# Patient Record
Sex: Female | Born: 1950 | Race: White | Hispanic: No | Smoking: Current every day smoker
Health system: Southern US, Community
[De-identification: ages and names within clinical notes are randomized; demographics above are authoritative.]

## PROBLEM LIST (undated history)

## (undated) DIAGNOSIS — R600 Localized edema: Secondary | ICD-10-CM

## (undated) DIAGNOSIS — I879 Disorder of vein, unspecified: Secondary | ICD-10-CM

## (undated) DIAGNOSIS — K219 Gastro-esophageal reflux disease without esophagitis: Secondary | ICD-10-CM

## (undated) DIAGNOSIS — Z9889 Other specified postprocedural states: Secondary | ICD-10-CM

## (undated) DIAGNOSIS — I2510 Atherosclerotic heart disease of native coronary artery without angina pectoris: Secondary | ICD-10-CM

## (undated) DIAGNOSIS — F4320 Adjustment disorder, unspecified: Secondary | ICD-10-CM

## (undated) DIAGNOSIS — F39 Unspecified mood [affective] disorder: Secondary | ICD-10-CM

## (undated) DIAGNOSIS — F411 Generalized anxiety disorder: Secondary | ICD-10-CM

## (undated) DIAGNOSIS — R251 Tremor, unspecified: Secondary | ICD-10-CM

## (undated) DIAGNOSIS — M1990 Unspecified osteoarthritis, unspecified site: Secondary | ICD-10-CM

## (undated) DIAGNOSIS — I219 Acute myocardial infarction, unspecified: Secondary | ICD-10-CM

## (undated) DIAGNOSIS — R112 Nausea with vomiting, unspecified: Secondary | ICD-10-CM

## (undated) HISTORY — DX: Unspecified mood (affective) disorder: F39

## (undated) HISTORY — DX: Adjustment disorder, unspecified: F43.20

## (undated) HISTORY — DX: Tremor, unspecified: R25.1

## (undated) HISTORY — PX: VEIN SURGERY: SHX48

## (undated) HISTORY — DX: Acute myocardial infarction, unspecified: I21.9

## (undated) HISTORY — PX: CARDIAC CATHETERIZATION: SHX172

## (undated) HISTORY — DX: Unspecified osteoarthritis, unspecified site: M19.90

## (undated) HISTORY — DX: Generalized anxiety disorder: F41.1

---

## 1971-06-17 HISTORY — PX: TUBAL LIGATION: SHX77

## 1993-06-16 DIAGNOSIS — I219 Acute myocardial infarction, unspecified: Secondary | ICD-10-CM

## 1993-06-16 HISTORY — DX: Acute myocardial infarction, unspecified: I21.9

## 1998-07-06 ENCOUNTER — Ambulatory Visit (HOSPITAL_COMMUNITY): Admission: RE | Admit: 1998-07-06 | Discharge: 1998-07-06 | Payer: Self-pay | Admitting: Obstetrics and Gynecology

## 1998-11-30 ENCOUNTER — Other Ambulatory Visit: Admission: RE | Admit: 1998-11-30 | Discharge: 1998-11-30 | Payer: Self-pay | Admitting: Obstetrics and Gynecology

## 1999-08-23 ENCOUNTER — Ambulatory Visit (HOSPITAL_BASED_OUTPATIENT_CLINIC_OR_DEPARTMENT_OTHER): Admission: RE | Admit: 1999-08-23 | Discharge: 1999-08-23 | Payer: Self-pay

## 1999-12-12 ENCOUNTER — Other Ambulatory Visit: Admission: RE | Admit: 1999-12-12 | Discharge: 1999-12-12 | Payer: Self-pay | Admitting: Orthopedic Surgery

## 2000-03-24 ENCOUNTER — Other Ambulatory Visit: Admission: RE | Admit: 2000-03-24 | Discharge: 2000-03-24 | Payer: Self-pay | Admitting: Obstetrics and Gynecology

## 2000-07-23 ENCOUNTER — Emergency Department (HOSPITAL_COMMUNITY): Admission: EM | Admit: 2000-07-23 | Discharge: 2000-07-24 | Payer: Self-pay | Admitting: Emergency Medicine

## 2000-07-24 ENCOUNTER — Encounter: Payer: Self-pay | Admitting: Emergency Medicine

## 2001-04-28 ENCOUNTER — Other Ambulatory Visit: Admission: RE | Admit: 2001-04-28 | Discharge: 2001-04-28 | Payer: Self-pay | Admitting: Internal Medicine

## 2002-05-17 ENCOUNTER — Other Ambulatory Visit: Admission: RE | Admit: 2002-05-17 | Discharge: 2002-05-17 | Payer: Self-pay | Admitting: Obstetrics and Gynecology

## 2002-05-26 ENCOUNTER — Emergency Department (HOSPITAL_COMMUNITY): Admission: EM | Admit: 2002-05-26 | Discharge: 2002-05-26 | Payer: Self-pay | Admitting: *Deleted

## 2002-06-23 ENCOUNTER — Inpatient Hospital Stay (HOSPITAL_COMMUNITY): Admission: EM | Admit: 2002-06-23 | Discharge: 2002-06-24 | Payer: Self-pay | Admitting: *Deleted

## 2002-06-23 ENCOUNTER — Encounter: Payer: Self-pay | Admitting: *Deleted

## 2002-06-24 ENCOUNTER — Inpatient Hospital Stay (HOSPITAL_COMMUNITY): Admission: EM | Admit: 2002-06-24 | Discharge: 2002-06-28 | Payer: Self-pay | Admitting: Psychiatry

## 2004-09-09 ENCOUNTER — Ambulatory Visit: Payer: Self-pay | Admitting: Pulmonary Disease

## 2005-07-26 ENCOUNTER — Emergency Department (HOSPITAL_COMMUNITY): Admission: EM | Admit: 2005-07-26 | Discharge: 2005-07-27 | Payer: Self-pay | Admitting: Emergency Medicine

## 2007-11-02 ENCOUNTER — Telehealth: Payer: Self-pay | Admitting: Pulmonary Disease

## 2007-11-17 ENCOUNTER — Encounter: Payer: Self-pay | Admitting: Pulmonary Disease

## 2007-11-17 DIAGNOSIS — R51 Headache: Secondary | ICD-10-CM

## 2007-11-17 DIAGNOSIS — R519 Headache, unspecified: Secondary | ICD-10-CM | POA: Insufficient documentation

## 2007-11-17 DIAGNOSIS — H8309 Labyrinthitis, unspecified ear: Secondary | ICD-10-CM | POA: Insufficient documentation

## 2008-10-30 ENCOUNTER — Emergency Department (HOSPITAL_COMMUNITY): Admission: EM | Admit: 2008-10-30 | Discharge: 2008-10-30 | Payer: Self-pay | Admitting: Emergency Medicine

## 2008-11-27 ENCOUNTER — Ambulatory Visit: Payer: Self-pay | Admitting: Vascular Surgery

## 2009-04-03 ENCOUNTER — Ambulatory Visit: Payer: Self-pay | Admitting: Vascular Surgery

## 2009-05-21 ENCOUNTER — Ambulatory Visit: Payer: Self-pay | Admitting: Vascular Surgery

## 2009-05-29 ENCOUNTER — Ambulatory Visit: Payer: Self-pay | Admitting: Vascular Surgery

## 2010-09-24 LAB — CBC
HCT: 38.8 % (ref 36.0–46.0)
Hemoglobin: 13.1 g/dL (ref 12.0–15.0)
MCHC: 33.9 g/dL (ref 30.0–36.0)
MCV: 82.9 fL (ref 78.0–100.0)
Platelets: 179 10*3/uL (ref 150–400)
RBC: 4.68 MIL/uL (ref 3.87–5.11)
RDW: 14.2 % (ref 11.5–15.5)
WBC: 10.6 10*3/uL — ABNORMAL HIGH (ref 4.0–10.5)

## 2010-09-24 LAB — DIFFERENTIAL
Basophils Absolute: 0.1 10*3/uL (ref 0.0–0.1)
Basophils Relative: 1 % (ref 0–1)
Eosinophils Absolute: 0.2 10*3/uL (ref 0.0–0.7)
Eosinophils Relative: 1 % (ref 0–5)
Lymphocytes Relative: 20 % (ref 12–46)
Lymphs Abs: 2.1 10*3/uL (ref 0.7–4.0)
Monocytes Absolute: 0.5 10*3/uL (ref 0.1–1.0)
Monocytes Relative: 5 % (ref 3–12)
Neutro Abs: 7.8 10*3/uL — ABNORMAL HIGH (ref 1.7–7.7)
Neutrophils Relative %: 73 % (ref 43–77)

## 2010-09-24 LAB — BASIC METABOLIC PANEL
BUN: 8 mg/dL (ref 6–23)
CO2: 30 mEq/L (ref 19–32)
Calcium: 9 mg/dL (ref 8.4–10.5)
Chloride: 103 mEq/L (ref 96–112)
Creatinine, Ser: 0.74 mg/dL (ref 0.4–1.2)
GFR calc Af Amer: >60 mL/min (ref >60)
GFR calc non Af Amer: >60 mL/min (ref >60)
Glucose, Bld: 129 mg/dL — ABNORMAL HIGH (ref 70–99)
Potassium: 3.4 mEq/L — ABNORMAL LOW (ref 3.5–5.1)
Sodium: 140 mEq/L (ref 135–145)

## 2010-09-24 LAB — POCT CARDIAC MARKERS
CKMB, poc: 1.7 ng/mL (ref 1.0–8.0)
Myoglobin, poc: 58.1 ng/mL (ref 12–200)
Myoglobin, poc: 81.1 ng/mL (ref 12–200)

## 2010-10-29 NOTE — Procedures (Signed)
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  The patient has a history of multiple injections of  varicose veins in the past.   EXAM:  Using color-flow imaging and pulse Doppler spectral analysis, the  right and left common femoral, superficial femoral, popliteal, posterior  tibial, greater and lesser saphenous veins are evaluated.  There is no  evidence suggesting deep venous insufficiency in the right or left lower  extremity.   The right and left saphenofemoral junctions are not competent (with  reflux of >500 milliseconds).  The right and left greater saphenous  veins are not competent (with reflux of 500 milliseconds)  with the  caliber as described below.   The right and left proximal short saphenous veins demonstrate  competency.   GSV Diameter (used if found to be incompetent only)                                            Right    Left  Proximal Greater Saphenous Vein           0.6 cm   0.7 cm  Proximal-to-mid-thigh                     0.5 cm   0.7 cm  Mid thigh                                 0.5 cm   0.6 cm  Mid-distal thigh                          0.5 cm   0.3 cm  Distal thigh                              0.6 cm   0.4 cm  Knee                                      0.8 cm   0.5 cm   IMPRESSION:  1. Right and left greater saphenous vein reflux ( with reflux of >500      milliseconds) is identified with the caliber ranging from 0.5 cm to      0.8 cm from the knee to the groin on the right.  On the left 0.3 cm      to 0.7 cm.  2. The right and left greater saphenous veins are not aneurysmal.  3. The right and left greater saphenous veins are not tortuous.  4. The deep venous system is competent.  5. The right and left lesser saphenous veins are competent.     ___________________________________________  Jillian [name]. Hart Rochester, M.D.   MC/MEDQ  D:  11/27/2008  T:  11/27/2008  Job:   [id number]

## 2010-10-29 NOTE — Consult Note (Signed)
NEW PATIENT CONSULTATION   Jillian [name], Jillian [name]  DOB:  1951/06/10                                       11/27/2008  ZOXWR#:[id number]   The patient is a 60 year old female with painful varicosities in both  lower extremities particularly in the medial thigh and medial calf  areas.  She has had treatment in the distant past by Dr. Marcy Panning  consisting of sclerotherapy of painful varicosities which have given her  short periods of relief, but she continues to have recurrence of these  symptoms.  She has no history of thrombophlebitis, deep venous  thrombosis, bleeding or stasis ulcers but does have swelling in  particular in the left leg as the day progresses, with pain in both  thighs and calves which she describes as stinging, burning and  throbbing.  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  She does take  ibuprofen with some improvement but was unable to relieve her symptoms.  This does affect her daily living including her work and activities at  home.  She has not worn elastic compression stockings.   PAST MEDICAL HISTORY:  1. Coronary artery disease, previous myocardial infarction 1995.  2. Negative for diabetes, hypertension, hyperlipidemia, COPD or      stroke.   FAMILY HISTORY:  Negative coronary artery disease, diabetes or stroke.   SOCIAL HISTORY:  She is single, has no children and manages a [employer] in town.  Smokes half pack cigarettes per day.  Drinks  occasional alcohol.   REVIEW OF SYSTEMS:  Negative for weight gain, shortness of breath,  palpitations, arrhythmias, bronchitis, wheezing.  Does have a history of  reflux esophagitis as well as arthritis and joint pain.   ALLERGIES:  To NARCOTICS which caused nausea and vomiting and passing  out.   MEDICINES:  Include aspirin, potassium replacement.   PHYSICAL EXAM:  Vital signs:  Blood pressure 108/78, heart  rate 76,  respirations 14.  General:  Middle-aged female in no apparent stress,  alert and oriented x3.  Neck:  Supple, 3+ carotid pulses palpable.  No  bruits are audible.  Neurologic:  Normal.  No palpable adenopathy in  neck.  Chest:  Clear to auscultation.  Cardiovascular:  Regular rhythm.  No murmurs.  Abdomen:  Soft, nontender with no masses.  She has 3+  femoral, popliteal, posterior tibial pulses bilaterally.  Both feet are  well-perfused.  There is no hyperpigmentation or ulceration noted.  Left  leg has some mild edema of 1+ compared to the right.  She has bulging  varicosities medial aspect of the distal left thigh and medial calf as  well as the right distal medial thigh and medial calf.  There is some  small varicosities posteriorly in the calf bilaterally.   Venous duplex exam performed in our office today reveals no evidence of  deep venous obstruction with normal deep venous system.  Both great  saphenous veins have diffuse reflux with valvular incompetence extending  from the knee to the saphenofemoral junction and the small saphenous  veins are competent bilaterally.   I think this patient is having definite symptomatic varicosities from  her valvular reflux in the saphenous systems bilaterally.  We will treat  her with long-leg elastic compression stockings (20 mm -  30 mm gradient)  as well as elevation of her legs as much as her job will allow and daily  ibuprofen.  She will return in 3 months and if there has been no  improvement I think she would be well served to have bilateral laser  ablation of the great saphenous veins with multiple stab phlebectomies  beginning on the left side.   Jillian [name] Rochester, M.D.  Electronically Signed   JDL/MEDQ  D:  11/27/2008  T:  11/28/2008  Job:  [id number]   cc:   Malachi Pro. Ambrose Mantle, M.D.

## 2010-10-29 NOTE — Assessment & Plan Note (Signed)
OFFICE VISIT   Duff, Nara K  DOB:  [birth year]                                       04/03/2009  QIONG#:[id number]   The patient returns for further followup regarding her severe venous  insufficiency of both lower extremities which resulted in painful  varicosities.  She continues to have aching, burning and throbbing  discomfort in both thigh and calf areas of the great saphenous system  where the bulging varicosities are located and this is not improved  wearing long-leg elastic compression stockings (20 mm - 30 mm gradient)  as well as elevation of the legs as much as her job will permit and  ibuprofen on a daily basis.  She develops swelling in the left calf and  ankle as the day progresses and her symptoms are affecting her during  work when she stands and also at home doing housework.  She does have  gross reflux in both great saphenous veins including the saphenofemoral  junction which communicate with these painful varicosities in the medial  thigh and calf.  She has had no chest pain, dyspnea on exertion, PND,  orthopnea or other problems.   I think we should proceed with staged procedures beginning on the left  side to include laser ablation and multiple stab phlebectomies on the  left initially followed by the right.  This is affecting her daily  living and ability to work and I think the symptoms can be improved or  relieved by these procedures.   [name], M.D.  Electronically Signed   JDL/MEDQ  D:  04/03/2009  T:  04/04/2009  Job:  [id number]   cc:   Malachi Pro. Ambrose Mantle, M.D.

## 2010-10-29 NOTE — Procedures (Signed)
DUPLEX DEEP VENOUS EXAM - LOWER EXTREMITY   INDICATION:  Followup left greater saphenous vein ablation.   HISTORY:  Edema:  No.  Trauma/Surgery:  Left greater saphenous vein ablation 05/21/2009.  Pain:  Minor tenderness.  PE:  No.  Previous DVT:  No.  Anticoagulants:  No.  Other:   DUPLEX EXAM:                CFV   SFV   PopV  PTV    GSV                R  L  R  L  R  L  R   L  R  L  Thrombosis    0  0     0     0      0     +  Spontaneous   +  +     +     +      +     0  Phasic        +  +     +     +      +     0  Augmentation  +  +     +     +      +     0  Compressible  +  +     +     +      +     0  Competent     +  +     +     +      +     0   Legend:  + - yes  o - no  p - partial  D - decreased   IMPRESSION:  1. No evidence of deep venous thrombosis in the left lower extremity      or right common femoral vein.  2. Evidence of ablation of the left greater saphenous vein without      flow from mid thigh to knee.    _____________________________  [name], M.D.   AS/MEDQ  D:  05/29/2009  T:  05/30/2009  Job:  [id number]

## 2010-10-29 NOTE — Assessment & Plan Note (Signed)
OFFICE VISIT   [name], Jillian K  DOB:  1950/08/13                                       05/21/2009  EAVWU#:[id number]   The patient underwent laser ablation of the left great saphenous vein  with multiple stab phlebectomies (10-20).  She required two separate  laser procedures.  The vein was cannulated from the proximal calf in the  GSV which would only extend to the mid thigh because of tortuous  varicosities.  A second puncture in the mid thigh up the great saphenous  vein to the saphenofemoral junction was performed and both sites had  laser ablation with a total of 80-100 joules.  She also had stab  phlebectomies and will return in 1 week for a duplex scan of the left  leg.  She has the right leg scheduled January 2011.   [name], M.D.  Electronically Signed   JDL/MEDQ  D:  05/21/2009  T:  05/22/2009  Job:  [id number]

## 2010-10-29 NOTE — Assessment & Plan Note (Signed)
OFFICE VISIT   [name], Jillian K  DOB:  [birth year]                                       05/29/2009  ZOXWR#:[id number]   The patient returns 1 week post laser ablation of the left great  saphenous vein for painful varicosities in the left leg with multiple  stab phlebectomies.  She had two separate laser ablations performed, one  from the proximal calf to the mid thigh and one from the mid thigh to  the saphenofemoral junction.  She has had some moderate discomfort in  the mid thigh area with some bruising.  No discomfort proximally.  She  has continued to have some mild distal edema but no pain in the stab  phlebectomy sites.  On examination she has some diffuse bruising in the  mid thigh as one would expect with some tenderness along the course of  the great saphenous vein.  There is mild edema distally.  She has a  palpable distal pulse in the left leg.   Venous duplex exam today reveals no evidence of deep venous obstruction.  She does have total ablation of the left great saphenous vein from the  proximal calf to the mid thigh but the proximal saphenous vein where the  second area of ablation was performed is widely patent with gross reflux  at the saphenofemoral junction.  This will require a second treatment  but I would like for the right leg to be done initially and let the  inflammation in the left leg resolve before proceeding with a repeat  laser ablation of the left proximal saphenous vein.  This will lead to  persistent symptoms and pain if this is not closed because she does have  gross reflux at the saphenofemoral junction down to the mid thigh.  The  distal vein however is totally ablated.  We will schedule her for her  right leg after the first of the year and then return for the redo of  the left great saphenous vein proximally at a later date.   [name], M.D.  Electronically Signed   JDL/MEDQ  D:  05/29/2009  T:  05/30/2009   Job:  [id number]
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
## 2010-11-01 NOTE — Op Note (Signed)
[place]  Patient:    Jillian [name], Jillian [name]                         MRN: [id number] Proc. Date: 08/23/99 Adm. Date:  [id number] Disc. Date: [id number] Attending:  Meredith Leeds                           Operative Report  PREOPERATIVE DIAGNOSIS:  Bilateral varicose veins.  POSTOPERATIVE DIAGNOSIS:  Bilateral varicose veins.  OPERATION:  Bilateral ligation and excision of varicose veins.  SURGEON:  Zigmund Daniel, M.D.  ANESTHESIA:  General.  DESCRIPTION OF PROCEDURE:  Before being taken into the operating room, I had the patient stand for a period of time, and marked all visible varicosities.  After  induction of anesthesia, adequate monitoring, and routine preparation and draping of the lower extremities, I made multiple incisions at the sites of marked varicosities and dissected them out.  I ligated perforating veins and small tributaries, and avulsed varicosities.  After getting hemostasis with pressure, I closed the skin incisions with intracuticular 4-0 Vicryl and Steri-Strips, and applied bulky compressive bandages.  She tolerated the operation well. DD:  09/10/99 TD:  09/11/99 Job: [id number]

## 2010-11-01 NOTE — Discharge Summary (Signed)
[place]  Patient:    Jillian [name], Jillian [name]                         MRN: [id number] Adm. Date:  [id number] Disc. Date: [id number] Attending:  Meredith Leeds                           Discharge Summary  NO DICTATION DD:  09/17/99 TD:  09/19/99 Job: [id number]
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
## 2011-06-17 HISTORY — PX: COLONOSCOPY W/ POLYPECTOMY: SHX1380

## 2012-02-09 ENCOUNTER — Other Ambulatory Visit: Payer: Self-pay | Admitting: *Deleted

## 2012-02-09 DIAGNOSIS — I83893 Varicose veins of bilateral lower extremities with other complications: Secondary | ICD-10-CM

## 2012-02-13 ENCOUNTER — Encounter: Payer: Self-pay | Admitting: Vascular Surgery

## 2012-02-17 ENCOUNTER — Ambulatory Visit: Payer: Self-pay | Admitting: Vascular Surgery

## 2012-07-22 ENCOUNTER — Ambulatory Visit (INDEPENDENT_AMBULATORY_CARE_PROVIDER_SITE_OTHER): Payer: BC Managed Care – PPO | Admitting: Family Medicine

## 2012-07-22 VITALS — BP 121/79 | HR 73 | Temp 98.0°F | Resp 18 | Ht 63.5 in | Wt 165.6 lb

## 2012-07-22 DIAGNOSIS — M7989 Other specified soft tissue disorders: Secondary | ICD-10-CM

## 2012-07-22 DIAGNOSIS — Z86718 Personal history of other venous thrombosis and embolism: Secondary | ICD-10-CM

## 2012-07-23 DIAGNOSIS — Z86718 Personal history of other venous thrombosis and embolism: Secondary | ICD-10-CM | POA: Insufficient documentation

## 2012-07-23 NOTE — Progress Notes (Signed)
  Subjective:    Patient ID: Jillian [name], female    DOB: July 08, 1950, 62 y.o.   MRN: [id number]  HPI  1.  LE edema:  Patient presented today s/p fall for worker's comp evaluation.  However, on physical examination noted LE edema.  Patient mentioned that a vein specialist told her that she had DVT after performing and ultrasound and "then walked out of the room."  Was never started on anticoagulation.  This occurred about 1 year ago.  LE edema has been persistent for past 3-5 years.    She went home and looked up what DVT is and became frightened, but did not return for evaluation.  No dyspnea, no history of DVT/PE prior to what vein specialist told her.  Has not seen another physician since then.   No fevers or chills, no redness/warmth to legs.  Swelling improved with leg elevation.   She does take daily baby ASA   Review of Systems See HPI above for review of systems.       Objective:   Physical Exam  Gen:  Alert, cooperative patient who appears stated age in no acute distress.  Vital signs reviewed. Pulm:  Clear to auscultation bilaterally with good air movement.  No wheezes or rales noted.   LE:  +2 edema noted BL LE's at ankles.  No calf tenderness BL.  No warmth or redness noted.  Multiple varicose veins noted BL.        Assessment & Plan:   1.  DVT, questionable history of: - Unclear if vein specialist was actually referring to superficial vein thrombosis - however, patient adamant he said DVT and she used the words "deep vein thrombosis" stating this is what she was told. - By now, this will likely have resolved.  - No unilateral swelling of one leg worse than other, no warmth, no tenderness.  No clear evidence of current DVT - However I think she warrants at least an [place] to ensure this has indeed resolved, if it was ever present in first case.  - Will call and discuss results with patients.  Of note, she is returning on Sunday for recheck of her knee

## 2012-07-27 ENCOUNTER — Telehealth: Payer: Self-pay | Admitting: Radiology

## 2012-07-27 NOTE — Progress Notes (Signed)
Please call GI and find out the status of her LE ultrasound to rule- out DVT.  I do not see the result yet.  This needs to be done as soon as possible- thanks!

## 2012-07-27 NOTE — Telephone Encounter (Signed)
I am checking status of the doppler study. This is not done yet. Lupita Leash in referrals will take care of this today. I am not clear why patient was not contacted to schedule this. Malin Cervini

## 2012-07-27 NOTE — Telephone Encounter (Signed)
Thanks for following up on this.  Jillian [name]

## 2012-07-30 ENCOUNTER — Telehealth: Payer: Self-pay | Admitting: Radiology

## 2012-07-30 ENCOUNTER — Other Ambulatory Visit: Payer: Self-pay

## 2012-07-30 NOTE — Telephone Encounter (Signed)
Please advise, it does not look like doppler study done yet.

## 2012-07-31 ENCOUNTER — Telehealth: Payer: Self-pay | Admitting: Family Medicine

## 2012-07-31 NOTE — Telephone Encounter (Signed)
Called her to check on the status of her ultrasound- it had been scheduled for Thursday but was canceled due to weather. She plans to have it done early this week at GI

## 2012-08-06 ENCOUNTER — Telehealth: Payer: Self-pay | Admitting: Family Medicine

## 2012-08-06 NOTE — Telephone Encounter (Signed)
Called and spoke to her again- the ultrasound is still not done.  I did encourage her to go ahead and do this in case she may actually have a DVT, explained that a DVT can become a PE and be dangerous. She does not feel that she can afford the ultrasound right now, but plans to do it in the next few few days.  Encouraged her to have this done as soon as she can

## 2012-08-06 NOTE — Progress Notes (Signed)
I have read, reviewed, and agree with plan of care by Dr. Gwendolyn Grant Please see phone notes- Ms. Rance has not yet had her ultrasound.  I have encouraged her to try and do this as soon as possible. From Dr. Tyson Alias notes it sees that an acute DVT is unlikely, but an ultrasound is a good next step

## 2012-08-30 NOTE — Telephone Encounter (Signed)
Patient referred to Jillian [name], she states they did not need doppler

## 2012-11-17 ENCOUNTER — Other Ambulatory Visit: Payer: Self-pay | Admitting: Orthopaedic Surgery

## 2012-11-18 ENCOUNTER — Encounter (HOSPITAL_BASED_OUTPATIENT_CLINIC_OR_DEPARTMENT_OTHER): Payer: Self-pay | Admitting: *Deleted

## 2012-11-18 NOTE — Progress Notes (Signed)
Pt has CAD-mi 1995-saw dr Tresa Endo until 2 yr ago-released-called for notes-no meds-does not see a dr-she has vericose veins-lower ankle edema-on feet all day-was seen by vein dr-wanted doppler studies done-did not have money-denies any calf pain or reddness-knee is swollen and painful-does smoke.

## 2012-11-19 ENCOUNTER — Ambulatory Visit
Admission: RE | Admit: 2012-11-19 | Discharge: 2012-11-19 | Disposition: A | Discharge status: Home / Self Care | Payer: BC Managed Care – PPO | Source: Ambulatory Visit | Attending: Family Medicine | Admitting: Family Medicine

## 2012-11-19 DIAGNOSIS — Z86718 Personal history of other venous thrombosis and embolism: Secondary | ICD-10-CM

## 2012-11-19 DIAGNOSIS — M7989 Other specified soft tissue disorders: Secondary | ICD-10-CM

## 2012-11-19 NOTE — H&P (Signed)
Jillian [name] is an 62 y.o. female.   Chief Complaint: Right knee medial meniscus tear HPI: Jillian [name] is having increasing right knee pain.  She has been through physical therapy but is having continued pain that is severe.  She is complaining of pain at nighttime as well as with ambulation that limits her activities.  MRI scan  Is positive for a medial meniscus tear right knee.  Past Medical History  Diagnosis Date  . Arthritis   . Myocardial infarction 1995    saw dr Tresa Endo   . PONV (postoperative nausea and vomiting)   . Edema leg   . Vein disorder     lower legs  . GERD (gastroesophageal reflux disease)   . Coronary artery disease     Past Surgical History  Procedure Laterality Date  . Tubal ligation  1973  . Colonoscopy w/ polypectomy  2013  . Vein surgery      History reviewed. No pertinent family history. Social History:  reports that she has been smoking.  She does not have any smokeless tobacco history on file. She reports that  drinks alcohol. She reports that she does not use illicit drugs.  Allergies:  Allergies  Allergen Reactions  . Opium Nausea And Vomiting    Patient states all narcotics make her N/V    No prescriptions prior to admission    No results found for this or any previous visit (from the past 48 hour(s)). No results found.  Review of Systems  Cardiovascular:       History of MI  All other systems reviewed and are negative.    Height 5' 3.5" (1.613 m), weight 74.844 kg (165 lb). Physical Exam  Constitutional: She is oriented to person, place, and time. She appears well-nourished.  HENT:  Head: Atraumatic.  Eyes: Conjunctivae are normal.  Neck: Normal range of motion.  Cardiovascular: Regular rhythm.   Respiratory: Breath sounds normal.  GI: Bowel sounds are normal.  Musculoskeletal:  Right knee exam: Trace effusion, range of motion 0-1 10.  Pain along the medial joint line to palpation.  Positive McMurray's for medial meniscus pain.   Mild crepitation.  Neurological: She is oriented to person, place, and time.  Skin: Skin is warm.  Psychiatric: She has a normal mood and affect.     Assessment/Plan Assessment: Right knee torn medial meniscus Plan Jillian [name] has tried therapy and things have gotten worse for her.  We have discussed proceeding with a knee arthroscopy to lessen her pain and improve her function.  We have reviewed the risk of anesthesia, infection and potential DVT related to knee arthroscopy.  Also discussed the need for physical therapy to optimize her good results.  Jillian [name] 11/19/2012, 1:13 PM

## 2012-11-20 ENCOUNTER — Telehealth: Payer: Self-pay

## 2012-11-20 NOTE — Telephone Encounter (Signed)
PATIENT STATES DR. Gwendolyn Grant ORDERED HER TO HAVE AN ULTRASOUND OF HER (R) KNEE. SHE HAD IT DONE YESTERDAY (FRIDAY). THEY TOLD HER WE WOULD HAVE THE RESULTS TODAY. SHE WOULD LIKE TO GET THEM AS SOON AS POSSIBLE. SHE HAS SURGERY SCHEDULED ON Tuesday MORNING. SHE WANTS TO BE SURE SHE CAN GO AHEAD WITH THE SURGERY AND THAT SHE DOES NOT HAVE A BLOOD CLOT.  BEST PHONE [phone] (HOME) (SHE WANTS A MESSAGE LEFT ON HER MACHINE IF SHE DOES NOT ANSWER)    PHARMACY CHOICE IS [facility] ON [address].   MBC

## 2012-11-21 NOTE — Telephone Encounter (Signed)
Appreciate calling the patient and discussing with her.  Agree with care.

## 2012-11-21 NOTE — Telephone Encounter (Signed)
1. No evidence of deep vein thrombosis in either lower extremity. 2. 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 3. Elongated right popliteal fossa Baker's cyst.  Patient advised. Told her to use compression stockings and to elevate when possible.  FYI to you patient aware of results.

## 2012-11-23 ENCOUNTER — Encounter (HOSPITAL_BASED_OUTPATIENT_CLINIC_OR_DEPARTMENT_OTHER): Payer: Self-pay | Admitting: Anesthesiology

## 2012-11-23 ENCOUNTER — Ambulatory Visit (HOSPITAL_BASED_OUTPATIENT_CLINIC_OR_DEPARTMENT_OTHER): Payer: Worker's Compensation | Admitting: Anesthesiology

## 2012-11-23 ENCOUNTER — Ambulatory Visit (HOSPITAL_BASED_OUTPATIENT_CLINIC_OR_DEPARTMENT_OTHER)
Admission: RE | Admit: 2012-11-23 | Discharge: 2012-11-23 | Disposition: A | Discharge status: Home / Self Care | Payer: Worker's Compensation | Source: Ambulatory Visit | Attending: Orthopaedic Surgery | Admitting: Orthopaedic Surgery

## 2012-11-23 ENCOUNTER — Encounter (HOSPITAL_BASED_OUTPATIENT_CLINIC_OR_DEPARTMENT_OTHER): Payer: Self-pay | Admitting: *Deleted

## 2012-11-23 ENCOUNTER — Encounter (HOSPITAL_BASED_OUTPATIENT_CLINIC_OR_DEPARTMENT_OTHER)
Admission: RE | Disposition: A | Discharge status: Home / Self Care | Payer: Self-pay | Source: Ambulatory Visit | Attending: Orthopaedic Surgery

## 2012-11-23 DIAGNOSIS — M23329 Other meniscus derangements, posterior horn of medial meniscus, unspecified knee: Secondary | ICD-10-CM | POA: Insufficient documentation

## 2012-11-23 DIAGNOSIS — K219 Gastro-esophageal reflux disease without esophagitis: Secondary | ICD-10-CM | POA: Insufficient documentation

## 2012-11-23 DIAGNOSIS — I2510 Atherosclerotic heart disease of native coronary artery without angina pectoris: Secondary | ICD-10-CM | POA: Insufficient documentation

## 2012-11-23 DIAGNOSIS — M129 Arthropathy, unspecified: Secondary | ICD-10-CM | POA: Insufficient documentation

## 2012-11-23 DIAGNOSIS — S83241A Other tear of medial meniscus, current injury, right knee, initial encounter: Secondary | ICD-10-CM

## 2012-11-23 DIAGNOSIS — I252 Old myocardial infarction: Secondary | ICD-10-CM | POA: Insufficient documentation

## 2012-11-23 DIAGNOSIS — M2240 Chondromalacia patellae, unspecified knee: Secondary | ICD-10-CM | POA: Insufficient documentation

## 2012-11-23 HISTORY — DX: Other specified postprocedural states: Z98.890

## 2012-11-23 HISTORY — PX: KNEE ARTHROSCOPY: SHX127

## 2012-11-23 HISTORY — DX: Nausea with vomiting, unspecified: R11.2

## 2012-11-23 HISTORY — DX: Gastro-esophageal reflux disease without esophagitis: K21.9

## 2012-11-23 HISTORY — DX: Atherosclerotic heart disease of native coronary artery without angina pectoris: I25.10

## 2012-11-23 HISTORY — DX: Localized edema: R60.0

## 2012-11-23 HISTORY — DX: Disorder of vein, unspecified: I87.9

## 2012-11-23 SURGERY — ARTHROSCOPY, KNEE
Anesthesia: General | Site: Knee | Laterality: Right | Wound class: Clean

## 2012-11-23 MED ORDER — OXYCODONE HCL 5 MG PO TABS: 5.0000 mg | ORAL_TABLET | Freq: Once | ORAL | Status: DC | PRN

## 2012-11-23 MED ORDER — SODIUM CHLORIDE 0.9 % IR SOLN
Status: DC | PRN
  Administered 2012-11-23: 3000 mL

## 2012-11-23 MED ORDER — LIDOCAINE HCL (CARDIAC) 20 MG/ML IV SOLN
INTRAVENOUS | Status: DC | PRN
  Administered 2012-11-23: 60 mg via INTRAVENOUS

## 2012-11-23 MED ORDER — PROPOFOL 10 MG/ML IV BOLUS
INTRAVENOUS | Status: DC | PRN
  Administered 2012-11-23: 160 mg via INTRAVENOUS

## 2012-11-23 MED ORDER — BUPIVACAINE-EPINEPHRINE PF 0.25-1:200000 % IJ SOLN
INTRAMUSCULAR | Status: DC | PRN
  Administered 2012-11-23: 20 mL

## 2012-11-23 MED ORDER — LACTATED RINGERS IV SOLN: INTRAVENOUS | Status: DC

## 2012-11-23 MED ORDER — CHLORHEXIDINE GLUCONATE 4 % EX LIQD: 60.0000 mL | Freq: Once | CUTANEOUS | Status: DC

## 2012-11-23 MED ORDER — ONDANSETRON HCL 4 MG/2ML IJ SOLN
INTRAMUSCULAR | Status: DC | PRN
  Administered 2012-11-23: 4 mg via INTRAVENOUS

## 2012-11-23 MED ORDER — PROMETHAZINE HCL 12.5 MG PO TABS: 12.5000 mg | ORAL_TABLET | Freq: Four times a day (QID) | ORAL | Status: DC | PRN

## 2012-11-23 MED ORDER — OXYCODONE HCL 5 MG/5ML PO SOLN: 5.0000 mg | Freq: Once | ORAL | Status: DC | PRN

## 2012-11-23 MED ORDER — TRAMADOL HCL 50 MG PO TABS: 50.0000 mg | ORAL_TABLET | Freq: Four times a day (QID) | ORAL | Status: DC | PRN

## 2012-11-23 MED ORDER — FENTANYL CITRATE 0.05 MG/ML IJ SOLN
INTRAMUSCULAR | Status: DC | PRN
  Administered 2012-11-23: 100 ug via INTRAVENOUS

## 2012-11-23 MED ORDER — CEFAZOLIN SODIUM-DEXTROSE 2-3 GM-% IV SOLR: 2.0000 g | INTRAVENOUS | Status: DC

## 2012-11-23 MED ORDER — MIDAZOLAM HCL 5 MG/5ML IJ SOLN
INTRAMUSCULAR | Status: DC | PRN
  Administered 2012-11-23: 1 mg via INTRAVENOUS

## 2012-11-23 MED ORDER — LACTATED RINGERS IV SOLN
INTRAVENOUS | Status: DC
  Administered 2012-11-23: 10:00:00 via INTRAVENOUS

## 2012-11-23 MED ORDER — HYDROMORPHONE HCL PF 1 MG/ML IJ SOLN
0.2500 mg | INTRAMUSCULAR | Status: DC | PRN
  Administered 2012-11-23: 0.5 mg via INTRAVENOUS

## 2012-11-23 MED ORDER — DEXTROSE 5 % IV SOLN: 3.0000 g | INTRAVENOUS | Status: DC

## 2012-11-23 MED ORDER — PROMETHAZINE HCL 25 MG/ML IJ SOLN
6.2500 mg | INTRAMUSCULAR | Status: DC | PRN
  Administered 2012-11-23: 6.25 mg via INTRAVENOUS

## 2012-11-23 MED ORDER — DEXAMETHASONE SODIUM PHOSPHATE 4 MG/ML IJ SOLN
INTRAMUSCULAR | Status: DC | PRN
  Administered 2012-11-23: 10 mg via INTRAVENOUS

## 2012-11-23 MED ORDER — HYDROMORPHONE HCL PF 1 MG/ML IJ SOLN: 0.2500 mg | INTRAMUSCULAR | Status: DC | PRN

## 2012-11-23 SURGICAL SUPPLY — 40 items
BANDAGE ELASTIC 6 VELCRO ST LF (GAUZE/BANDAGES/DRESSINGS) ×2 IMPLANT
BANDAGE GAUZE ELAST BULKY 4 IN (GAUZE/BANDAGES/DRESSINGS) ×2 IMPLANT
BLADE CUDA 5.5 (BLADE) IMPLANT
BLADE GREAT WHITE 4.2 (BLADE) ×2 IMPLANT
CANISTER OMNI JUG 16 LITER (MISCELLANEOUS) IMPLANT
CANISTER SUCTION 2500CC (MISCELLANEOUS) IMPLANT
DRAPE ARTHROSCOPY W/POUCH 114 (DRAPES) ×2 IMPLANT
DRAPE U-SHAPE 47X51 STRL (DRAPES) ×2 IMPLANT
DRSG EMULSION OIL 3X3 NADH (GAUZE/BANDAGES/DRESSINGS) ×2 IMPLANT
DURAPREP 26ML APPLICATOR (WOUND CARE) ×2 IMPLANT
ELECT MENISCUS 165MM 90D (ELECTRODE) IMPLANT
ELECT REM PT RETURN 9FT ADLT (ELECTROSURGICAL)
ELECTRODE REM PT RTRN 9FT ADLT (ELECTROSURGICAL) IMPLANT
GLOVE BIO SURGEON STRL SZ 6.5 (GLOVE) ×2 IMPLANT
GLOVE BIO SURGEON STRL SZ8.5 (GLOVE) ×2 IMPLANT
GLOVE BIOGEL PI IND STRL 7.0 (GLOVE) IMPLANT
GLOVE BIOGEL PI IND STRL 8 (GLOVE) ×1 IMPLANT
GLOVE BIOGEL PI IND STRL 8.5 (GLOVE) ×1 IMPLANT
GLOVE BIOGEL PI INDICATOR 7.0 (GLOVE) ×2
GLOVE BIOGEL PI INDICATOR 8 (GLOVE) ×1
GLOVE BIOGEL PI INDICATOR 8.5 (GLOVE) ×1
GLOVE SS BIOGEL STRL SZ 8 (GLOVE) ×1 IMPLANT
GLOVE SUPERSENSE BIOGEL SZ 8 (GLOVE) ×1
GOWN PREVENTION PLUS XLARGE (GOWN DISPOSABLE) ×3 IMPLANT
GOWN PREVENTION PLUS XXLARGE (GOWN DISPOSABLE) ×2 IMPLANT
IV NS IRRIG 3000ML ARTHROMATIC (IV SOLUTION) ×2 IMPLANT
KNEE WRAP E Z 3 GEL PACK (MISCELLANEOUS) ×2 IMPLANT
PACK ARTHROSCOPY DSU (CUSTOM PROCEDURE TRAY) ×2 IMPLANT
PACK BASIN DAY SURGERY FS (CUSTOM PROCEDURE TRAY) ×2 IMPLANT
PENCIL BUTTON HOLSTER BLD 10FT (ELECTRODE) IMPLANT
SET ARTHROSCOPY TUBING (MISCELLANEOUS) ×2
SET ARTHROSCOPY TUBING LN (MISCELLANEOUS) ×1 IMPLANT
SHEET MEDIUM DRAPE 40X70 STRL (DRAPES) ×2 IMPLANT
SPONGE GAUZE 4X4 12PLY (GAUZE/BANDAGES/DRESSINGS) ×2 IMPLANT
SYR 3ML 18GX1 1/2 (SYRINGE) IMPLANT
TOWEL OR 17X24 6PK STRL BLUE (TOWEL DISPOSABLE) ×2 IMPLANT
TOWEL OR NON WOVEN STRL DISP B (DISPOSABLE) ×2 IMPLANT
WAND 30 DEG SABER W/CORD (SURGICAL WAND) IMPLANT
WAND STAR VAC 90 (SURGICAL WAND) IMPLANT
WATER STERILE IRR 1000ML POUR (IV SOLUTION) ×2 IMPLANT

## 2012-11-23 NOTE — Anesthesia Postprocedure Evaluation (Signed)
  Anesthesia Post-op Note  Patient: Jillian [name]  Procedure(s) Performed: Procedure(s): RIGHT KNEE ARTHROSCOPY PARTIAL MEDIAL MENISECTOMY CHRONDROPLASTY (Right)  Patient Location: PACU  Anesthesia Type:General  Level of Consciousness: awake and alert   Airway and Oxygen Therapy: Patient Spontanous Breathing  Post-op Pain: mild  Post-op Assessment: Post-op Vital signs reviewed, Patient's Cardiovascular Status Stable, Respiratory Function Stable, Patent Airway, No signs of Nausea or vomiting and Pain level controlled  Post-op Vital Signs: stable  Complications: No apparent anesthesia complications

## 2012-11-23 NOTE — Interval H&P Note (Signed)
History and Physical Interval Note:  11/23/2012 10:09 AM  Jillian [name]  has presented today for surgery, with the diagnosis of Right Knee Medial Meniscus Tear  The various methods of treatment have been discussed with the patient and family. After consideration of risks, benefits and other options for treatment, the patient has consented to  Procedure(s): RIGHT KNEE ARTHROSCOPY  (Right) as a surgical intervention .  The patient's history has been reviewed, patient examined, no change in status, stable for surgery.  I have reviewed the patient's chart and labs.  Questions were answered to the patient's satisfaction.     Blima Jaimes G

## 2012-11-23 NOTE — Op Note (Signed)
#  [id number] 

## 2012-11-23 NOTE — Anesthesia Procedure Notes (Signed)
Procedure Name: LMA Insertion Date/Time: 11/23/2012 10:19 AM Performed by: Meyer Russel Pre-anesthesia Checklist: Patient identified, Emergency Drugs available, Suction available and Patient being monitored Patient Re-evaluated:Patient Re-evaluated prior to inductionOxygen Delivery Method: Circle System Utilized Preoxygenation: Pre-oxygenation with 100% oxygen Intubation Type: IV induction Ventilation: Mask ventilation without difficulty LMA: LMA inserted LMA Size: 4.0 Number of attempts: 1 Airway Equipment and Method: bite block Placement Confirmation: positive ETCO2 and breath sounds checked- equal and bilateral Tube secured with: Tape Dental Injury: Teeth and Oropharynx as per pre-operative assessment

## 2012-11-23 NOTE — Anesthesia Preprocedure Evaluation (Signed)
Anesthesia Evaluation  Patient identified by MRN, date of birth, ID band Patient awake    Reviewed: Allergy & Precautions, H&P , NPO status , Patient's Chart, lab work & pertinent test results  History of Anesthesia Complications (+) PONV  Airway Mallampati: II TM Distance: >3 FB Neck ROM: Full    Dental   Pulmonary          Cardiovascular + CAD and + Past MI Rhythm:Regular Rate:Normal     Neuro/Psych  Headaches,    GI/Hepatic GERD-  ,  Endo/Other    Renal/GU      Musculoskeletal   Abdominal   Peds  Hematology   Anesthesia Other Findings   Reproductive/Obstetrics                           Anesthesia Physical Anesthesia Plan  ASA: III  Anesthesia Plan: General   Post-op Pain Management:    Induction: Intravenous  Airway Management Planned: LMA  Additional Equipment:   Intra-op Plan:   Post-operative Plan: Extubation in OR  Informed Consent: I have reviewed the patients History and Physical, chart, labs and discussed the procedure including the risks, benefits and alternatives for the proposed anesthesia with the patient or authorized representative who has indicated his/her understanding and acceptance.     Plan Discussed with: CRNA and Surgeon  Anesthesia Plan Comments:         Anesthesia Quick Evaluation

## 2012-11-23 NOTE — Transfer of Care (Signed)
Immediate Anesthesia Transfer of Care Note  Patient: Jillian [name]  Procedure(s) Performed: Procedure(s): RIGHT KNEE ARTHROSCOPY PARTIAL MEDIAL MENISECTOMY CHRONDROPLASTY (Right)  Patient Location: PACU  Anesthesia Type:General  Level of Consciousness: awake, alert  and oriented  Airway & Oxygen Therapy: Patient Spontanous Breathing and Patient connected to face mask oxygen  Post-op Assessment: Report given to PACU RN, Post -op Vital signs reviewed and stable and Patient moving all extremities  Post vital signs: Reviewed and stable  Complications: No apparent anesthesia complications

## 2012-11-24 ENCOUNTER — Encounter (HOSPITAL_BASED_OUTPATIENT_CLINIC_OR_DEPARTMENT_OTHER): Payer: Self-pay | Admitting: Orthopaedic Surgery

## 2012-11-24 NOTE — Op Note (Signed)
[name], Jillian [name]               ACCOUNT NO.:  [id number]  MEDICAL RECORD NO.:  [id number]  LOCATION:                                 FACILITY:  PHYSICIAN:  Lubertha Basque. Lazara Grieser, M.D.DATE OF BIRTH:  03/31/1950  DATE OF PROCEDURE:  11/23/2012 DATE OF DISCHARGE:  11/23/2012                              OPERATIVE REPORT   PREOPERATIVE DIAGNOSES: 1. Right knee torn medial meniscus. 2. Right knee chondromalacia of the patella.  POSTOPERATIVE DIAGNOSES: 1. Right knee torn medial meniscus. 2. Right knee chondromalacia of the patella.  PROCEDURES: 1. Right knee partial medial meniscectomy. 2. Right knee abrasion chondroplasty, patellofemoral.  ANESTHESIA:  General.  ATTENDING SURGEON:  Lubertha Basque. Jerl Santos, M.D.  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  She has had several months of intense right knee pain and occasional swelling.  This is persisted despite oral anti- inflammatories and physical therapy.  By MRI scan, she has a torn medial meniscus, then at this point, she is offered an arthroscopy.  Informed operative consent was obtained after discussion of possible complications including reaction to anesthesia and infection.  SUMMARY OF FINDINGS AND PROCEDURE:  Under general anesthesia, an arthroscopy of the right knee was performed.  The suprapatellar pouch was benign while the patellofemoral joint exhibited focal breakdown at the apex of the patella, addressed with abrasion chondroplasty to bleeding bone in 1 small area.  The patella tracked very well.  The medial compartment was notable for a posterior horn medial meniscus tear addressed about 10% partial medial meniscectomy.  She had some grade 3 change in this compartment, but no exposed bone.  The chondroplasty was done here.  The ACL looked intact.  The lateral compartment exhibited a small posterior horn lateral meniscus tear addressed  with a brief partial lateral meniscectomy and there were no degenerative changes in this compartment.  She was discharged home the same day.  DESCRIPTION OF PROCEDURE:  The patient was taken to the operating suite, where general anesthetic was applied without difficulty.  She was positioned supine and prepped and draped in normal sterile fashion. After the administration of IV Kefzol and appropriate time-out, an arthroscopy of the right knee was performed through total of 2 portals. Findings were as noted above and procedures consisted of the abrasion chondroplasty as described and the standard chondroplasty plus the partial medial and partial lateral meniscectomies.  The major portion of the case was the partial medial meniscectomy done with a basket and shaver, taking about 10% of this structure back to stable tissues.  Her knee was thoroughly irrigated, followed by a placement of Marcaine with epinephrine and morphine.  Adaptic was placed over the portals, followed by dry gauze and a loose Ace wrap.  Estimated blood loss and intraoperative fluids obtained from anesthesia records.  DISPOSITION:  The patient was extubated in the operating room and taken to recovery room in stable condition.  Plans were for to go home the same-day and to follow up in the office in less than a week.  I will contact her by phone tonight.     Lubertha Basque Zuri Lascala,  M.D.     PGD/MEDQ  D:  11/23/2012  T:  11/23/2012  Job:  [id number]

## 2014-07-14 ENCOUNTER — Encounter (HOSPITAL_COMMUNITY): Payer: Self-pay | Admitting: Emergency Medicine

## 2014-07-14 ENCOUNTER — Emergency Department (INDEPENDENT_AMBULATORY_CARE_PROVIDER_SITE_OTHER)
Admission: EM | Admit: 2014-07-14 | Discharge: 2014-07-14 | Disposition: A | Discharge status: Home / Self Care | Payer: Self-pay | Source: Home / Self Care | Attending: Family Medicine | Admitting: Family Medicine

## 2014-07-14 DIAGNOSIS — J40 Bronchitis, not specified as acute or chronic: Secondary | ICD-10-CM

## 2014-07-14 MED ORDER — IPRATROPIUM-ALBUTEROL 0.5-2.5 (3) MG/3ML IN SOLN
3.0000 mL | Freq: Once | RESPIRATORY_TRACT | Status: AC
  Administered 2014-07-14: 3 mL via RESPIRATORY_TRACT

## 2014-07-14 MED ORDER — IPRATROPIUM-ALBUTEROL 0.5-2.5 (3) MG/3ML IN SOLN
RESPIRATORY_TRACT | Status: AC
  Filled 2014-07-14: qty 3

## 2014-07-14 MED ORDER — PREDNISONE 50 MG PO TABS: 50.0000 mg | ORAL_TABLET | Freq: Every day | ORAL | Status: DC

## 2014-07-14 MED ORDER — ALBUTEROL SULFATE HFA 108 (90 BASE) MCG/ACT IN AERS: 2.0000 | INHALATION_SPRAY | Freq: Four times a day (QID) | RESPIRATORY_TRACT | Status: DC | PRN

## 2014-07-14 NOTE — ED Provider Notes (Signed)
Jillian GosselinDeborah K Derksen is a 64 y.o. female who presents to Urgent Care today for cough. Patient has an eight-day history of cough. She initially had sore throat and some fever but that has since resolved. She also notes a mild current runny nose. No shortness of breath or wheezing. No chest pain or palpitations. She's tried Mucinex which helps some.   Past Medical History  Diagnosis Date  . Arthritis   . Myocardial infarction 1995    saw dr Tresa Endokelly   . PONV (postoperative nausea and vomiting)   . Edema leg   . Vein disorder     lower legs  . GERD (gastroesophageal reflux disease)   . Coronary artery disease    Past Surgical History  Procedure Laterality Date  . Tubal ligation  1973  . Colonoscopy w/ polypectomy  2013  . Vein surgery    . Knee arthroscopy Right 11/23/2012    Procedure: RIGHT KNEE ARTHROSCOPY PARTIAL MEDIAL MENISECTOMY CHRONDROPLASTY;  Surgeon: Velna OchsPeter G Dalldorf, MD;  Location: [facility];  Service: Orthopedics;  Laterality: Right;   History  Substance Use Topics  . Smoking status: Current Every Day Smoker -- 0.50 packs/day  . Smokeless tobacco: Not on file  . Alcohol Use: Yes     Comment: rare   ROS as above Medications: Current Facility-Administered Medications  Medication Dose Route Frequency Provider Last Rate Last Dose  . ipratropium-albuterol (DUONEB) 0.5-2.5 (3) MG/3ML nebulizer solution 3 mL  3 mL Nebulization Once Rodolph BongEvan S Tyquan Carmickle, MD       Current Outpatient Prescriptions  Medication Sig Dispense Refill  . aspirin 81 MG tablet Take 81 mg by mouth daily.    Marland Kitchen. ibuprofen (ADVIL,MOTRIN) 200 MG tablet Take 200 mg by mouth every 6 (six) hours as needed for pain.    . meclizine (ANTIVERT) 25 MG tablet Take 25 mg by mouth 3 (three) times daily as needed.    . promethazine (PHENERGAN) 12.5 MG tablet Take 1 tablet (12.5 mg total) by mouth every 6 (six) hours as needed for nausea. 30 tablet 0  . ranitidine (ZANTAC) 150 MG tablet Take 150 mg by mouth as needed  for heartburn.    . traMADol (ULTRAM) 50 MG tablet Take 1 tablet (50 mg total) by mouth every 6 (six) hours as needed for pain. 30 tablet 1   Allergies  Allergen Reactions  . Hydrocodone Nausea And Vomiting  . Opium Nausea And Vomiting    Patient states all narcotics make her N/V     Exam:  BP 112/72 mmHg  Pulse 86  Temp(Src) 98.4 F (36.9 C) (Oral)  Resp 18  SpO2 96% Gen: Well NAD HEENT: EOMI,  MMM posterior pharynx with cobblestoning. Normal tympanic membranes bilaterally. Lungs: Normal work of breathing. CTABL Heart: RRR no MRG Abd: NABS, Soft. Nondistended, Nontender Exts: Brisk capillary refill, warm and well perfused.   Patient was given a 2.5/0.5 mg DuoNeb nebulizer treatment, and felt a lot better  No results found for this or any previous visit (from the past 24 hour(s)). No results found.  Assessment and Plan: 64 y.o. female with bronchitis versus COPD exacerbation. Treat with prednisone and albuterol.  Discussed warning signs or symptoms. Please see discharge instructions. Patient expresses understanding.     Rodolph BongEvan S Charlotte Brafford, MD 07/14/14 1314

## 2014-07-14 NOTE — ED Notes (Signed)
C/o cold onset 8 days Sx include: productive cough, fevers, weakness, runny nose, congestion Denies SOB, wheezing Smokes 0.5 PPD; taking OTC cold meds w/no relief Alert, no signs of acute distress.

## 2014-07-14 NOTE — Discharge Instructions (Signed)
Thank you for coming in today. Use albuterol as needed for wheezing and shortness of breath or cough. Use prednisone daily for 5 days. Call or go to the emergency room if you get worse, have trouble breathing, have chest pains, or palpitations.   Acute Bronchitis Bronchitis is inflammation of the airways that extend from the windpipe into the lungs (bronchi). The inflammation often causes mucus to develop. This leads to a cough, which is the most common symptom of bronchitis.  In acute bronchitis, the condition usually develops suddenly and goes away over time, usually in a couple weeks. Smoking, allergies, and asthma can make bronchitis worse. Repeated episodes of bronchitis may cause further lung problems.  CAUSES Acute bronchitis is most often caused by the same virus that causes a cold. The virus can spread from person to person (contagious) through coughing, sneezing, and touching contaminated objects. SIGNS AND SYMPTOMS   Cough.   Fever.   Coughing up mucus.   Body aches.   Chest congestion.   Chills.   Shortness of breath.   Sore throat.  DIAGNOSIS  Acute bronchitis is usually diagnosed through a physical exam. Your health care provider will also ask you questions about your medical history. Tests, such as chest X-rays, are sometimes done to rule out other conditions.  TREATMENT  Acute bronchitis usually goes away in a couple weeks. Oftentimes, no medical treatment is necessary. Medicines are sometimes given for relief of fever or cough. Antibiotic medicines are usually not needed but may be prescribed in certain situations. In some cases, an inhaler may be recommended to help reduce shortness of breath and control the cough. A cool mist vaporizer may also be used to help thin bronchial secretions and make it easier to clear the chest.  HOME CARE INSTRUCTIONS  Get plenty of rest.   Drink enough fluids to keep your urine clear or pale yellow (unless you have a  medical condition that requires fluid restriction). Increasing fluids may help thin your respiratory secretions (sputum) and reduce chest congestion, and it will prevent dehydration.   Take medicines only as directed by your health care provider.  If you were prescribed an antibiotic medicine, finish it all even if you start to feel better.  Avoid smoking and secondhand smoke. Exposure to cigarette smoke or irritating chemicals will make bronchitis worse. If you are a smoker, consider using nicotine gum or skin patches to help control withdrawal symptoms. Quitting smoking will help your lungs heal faster.   Reduce the chances of another bout of acute bronchitis by washing your hands frequently, avoiding people with cold symptoms, and trying not to touch your hands to your mouth, nose, or eyes.   Keep all follow-up visits as directed by your health care provider.  SEEK MEDICAL CARE IF: Your symptoms do not improve after 1 week of treatment.  SEEK IMMEDIATE MEDICAL CARE IF:  You develop an increased fever or chills.   You have chest pain.   You have severe shortness of breath.  You have bloody sputum.   You develop dehydration.  You faint or repeatedly feel like you are going to pass out.  You develop repeated vomiting.  You develop a severe headache. MAKE SURE YOU:   Understand these instructions.  Will watch your condition.  Will get help right away if you are not doing well or get worse. Document Released: 07/10/2004 Document Revised: 10/17/2013 Document Reviewed: 11/23/2012 [facility] Patient Information 2015 [place], [place]. This information is not intended to replace advice  given to you by your health care provider. Make sure you discuss any questions you have with your health care provider.

## 2015-05-22 DIAGNOSIS — K219 Gastro-esophageal reflux disease without esophagitis: Secondary | ICD-10-CM | POA: Insufficient documentation

## 2015-05-22 DIAGNOSIS — F410 Panic disorder [episodic paroxysmal anxiety] without agoraphobia: Secondary | ICD-10-CM | POA: Insufficient documentation

## 2015-05-22 DIAGNOSIS — F339 Major depressive disorder, recurrent, unspecified: Secondary | ICD-10-CM | POA: Insufficient documentation

## 2015-05-22 DIAGNOSIS — M8580 Other specified disorders of bone density and structure, unspecified site: Secondary | ICD-10-CM | POA: Insufficient documentation

## 2015-05-22 DIAGNOSIS — R251 Tremor, unspecified: Secondary | ICD-10-CM | POA: Insufficient documentation

## 2015-08-28 DIAGNOSIS — I83899 Varicose veins of unspecified lower extremities with other complications: Secondary | ICD-10-CM | POA: Insufficient documentation

## 2015-09-04 DIAGNOSIS — G4700 Insomnia, unspecified: Secondary | ICD-10-CM | POA: Insufficient documentation

## 2015-09-04 DIAGNOSIS — F419 Anxiety disorder, unspecified: Secondary | ICD-10-CM | POA: Insufficient documentation

## 2015-09-12 DIAGNOSIS — I872 Venous insufficiency (chronic) (peripheral): Secondary | ICD-10-CM | POA: Insufficient documentation

## 2015-09-20 DIAGNOSIS — F331 Major depressive disorder, recurrent, moderate: Secondary | ICD-10-CM | POA: Insufficient documentation

## 2015-09-20 DIAGNOSIS — F411 Generalized anxiety disorder: Secondary | ICD-10-CM | POA: Insufficient documentation

## 2015-12-20 DIAGNOSIS — R7303 Prediabetes: Secondary | ICD-10-CM | POA: Insufficient documentation

## 2017-06-30 ENCOUNTER — Ambulatory Visit
Admission: RE | Admit: 2017-06-30 | Discharge: 2017-06-30 | Disposition: A | Discharge status: Home / Self Care | Payer: Medicare Other | Source: Ambulatory Visit | Attending: Family Medicine | Admitting: Family Medicine

## 2017-06-30 ENCOUNTER — Other Ambulatory Visit: Payer: Self-pay | Admitting: Family Medicine

## 2017-06-30 DIAGNOSIS — S0990XA Unspecified injury of head, initial encounter: Secondary | ICD-10-CM

## 2017-06-30 DIAGNOSIS — S82002A Unspecified fracture of left patella, initial encounter for closed fracture: Secondary | ICD-10-CM | POA: Insufficient documentation

## 2017-06-30 DIAGNOSIS — S0993XA Unspecified injury of face, initial encounter: Secondary | ICD-10-CM

## 2018-05-19 ENCOUNTER — Ambulatory Visit: Payer: Medicare Other | Admitting: Neurology

## 2018-05-19 ENCOUNTER — Telehealth: Payer: Self-pay | Admitting: Neurology

## 2018-05-19 ENCOUNTER — Encounter: Payer: Self-pay | Admitting: Neurology

## 2018-05-19 VITALS — BP 117/69 | HR 74 | Ht 65.0 in | Wt 180.0 lb

## 2018-05-19 DIAGNOSIS — R251 Tremor, unspecified: Secondary | ICD-10-CM | POA: Diagnosis not present

## 2018-05-19 DIAGNOSIS — R498 Other voice and resonance disorders: Secondary | ICD-10-CM | POA: Diagnosis not present

## 2018-05-19 DIAGNOSIS — F439 Reaction to severe stress, unspecified: Secondary | ICD-10-CM | POA: Diagnosis not present

## 2018-05-19 NOTE — Patient Instructions (Signed)
You don't have classic symptoms of essential or familial tremor.   I do not see any classic signs or symptoms of parkinson's like disease or what we call parkinsonism.   You do have a mild appearing tremor affecting both hands and head, left more than right. For this, I would not recommend any new medication for fear of side effects or medication interaction.   I do want to suggest a few things today:   Remember to drink plenty of fluid at least 6 glasses (8 oz each), eat healthy meals and do not skip any meals. Try to eat protein with a every meal and eat a healthy snack such as fruit or nuts in between meals. Try to keep a regular sleep-wake schedule and try to exercise daily, particularly in the form of walking, 20-30 minutes a day, if you can.   Try to stay active physically and mentally. Engage in social activities in your community and with your family and try to keep up with current events by reading the newspaper or watching the news. Try to do word puzzles and you may like to do puzzles and brain games on the computer such as on [email or web address].   As far as your medications are concerned, I would like hold off on any meds at this time.   As far as diagnostic testing:   We will do a brain scan, called MRI and call you with the test results. We will have to schedule you for this on a separate date. This test requires authorization from your insurance, and we will take care of the insurance process.  DaT scan: This is a specialized brain scan designed to help with diagnosis of tremor disorders. A radioactive marker gets injected and the uptake is measured in the brain and compared to normal controls and right side is compared to the left, a change in uptake can help with diagnosis of certain tremor disorders. A brain MRI on the other hand is a brain scan that helps look at the brain structure in more detail overall and look for age-related changes, blood vessel related changes and look for  stroke and volume loss which we call atrophy.

## 2018-05-19 NOTE — Progress Notes (Signed)
Subjective:    Patient ID: Jillian [name] is a 67 y.o. female.  HPI     Huston Foley, MD, PhD [facility] [address] [address] [place], [place] [address]  Dear Dr. Hal Hope,   I saw your patient, Jillian [name], upon your kind request in my neurologic clinic today for initial consultation of her tremors. The patient is accompanied by her sister today. As you know, [name] is a 67 year old right-handed woman with an underlying medical history coronary artery disease with status post MI, leg edema, arthritis, reflux disease, anxiety and obesity, who reports a bilateral hand tremor for the past 5 years. She first noticed a tremor in 2014 but in the past 2 years it has progressed. It is primarily in her left hand. She has noticed a head tremor as well. She is often embarrassed about her tremor. She has no classic family history of essential tremor. She had a total of 2 sisters and 2 brothers, 1 sister passed away by suicide at age 30. One brother passed away from complications of blood clot. She is divorced, has no children, sister that is with her today lives next door. Patient endorses history of anxiety and stress. She had lost her sister to suicide when patient was 45 years old and she had a heart attack shortly thereafter. In the recent past, she lost 2 other family members to suicide (sister's daughter and that niece's husband). Her father passed away last year at age 43. Mom is 55 years old.she smokes half pack per day and drinks alcohol occasionally, drinks caffeine daily. She admits that she does not always hydrate well enough. When she first started noticing the tremor she reduced her caffeine intake. She has not fallen recently but fell last year.  I reviewed your office note from 03/22/2018, which you kindly included.  Of note, she had a head CT without contrast and maxillofacial CT without contrast on 06/30/2017 after a fall: IMPRESSION: Normal head CT.   Moderate right to left nasal septal deviation is noted secondary to concha bullosa, which is normal variant. No acute abnormality seen in the maxillofacial region.  She had blood work through your office on 06/30/2017 and I reviewed the results: CBC with differential was unremarkable, CMP was unremarkable.   She was started on propranolol long-acting in October 2019 but feels it has not helped. Of note, she is on sertraline 50 mg once daily and has been on it for about a year. She is on Seroquel 100 mg at bedtime and has been on it for less than a year.  Her Past Medical History Is Significant For: Past Medical History:  Diagnosis Date  . Adjustment disorder, unspecified   . Arthritis   . Coronary artery disease   . Edema leg   . GAD (generalized anxiety disorder)   . GERD (gastroesophageal reflux disease)   . Myocardial infarction [facility]) 1995   saw dr Tresa Endo   . PONV (postoperative nausea and vomiting)   . Tremor, unspecified   . Unspecified mood (affective) disorder (HCC)   . Vein disorder    lower legs    Her Past Surgical History Is Significant For: Past Surgical History:  Procedure Laterality Date  . COLONOSCOPY W/ POLYPECTOMY  2013  . KNEE ARTHROSCOPY Right 11/23/2012   Procedure: RIGHT KNEE ARTHROSCOPY PARTIAL MEDIAL MENISECTOMY CHRONDROPLASTY;  Surgeon: Velna Ochs, MD;  Location: [facility];  Service: Orthopedics;  Laterality: Right;  . TUBAL LIGATION  39  . VEIN SURGERY      Her Family History Is Significant For: No family history on file.  Her Social History Is Significant For: Social History   Socioeconomic History  . Marital status: Divorced    Spouse name: Not on file  . Number of children: Not on file  . Years of education: Not on file  . Highest education level: Not on file  Occupational History  . Not on file  Social Needs  . Financial resource strain: Not on file  . Food insecurity:    Worry: Not on file    Inability: Not on  file  . Transportation needs:    Medical: Not on file    Non-medical: Not on file  Tobacco Use  . Smoking status: Current Every Day Smoker    Packs/day: 0.50  . Smokeless tobacco: Never Used  Substance and Sexual Activity  . Alcohol use: Yes    Comment: rare  . Drug use: No  . Sexual activity: Not on file  Lifestyle  . Physical activity:    Days per week: Not on file    Minutes per session: Not on file  . Stress: Not on file  Relationships  . Social connections:    Talks on phone: Not on file    Gets together: Not on file    Attends religious service: Not on file    Active member of club or organization: Not on file    Attends meetings of clubs or organizations: Not on file    Relationship status: Not on file  Other Topics Concern  . Not on file  Social History Narrative  . Not on file    Her Allergies Are:  Allergies  Allergen Reactions  . Hydrocodone Nausea And Vomiting  . Opium Nausea And Vomiting    Patient states all narcotics make her N/V  :   Her Current Medications Are:  Outpatient Encounter Medications as of 05/19/2018  Medication Sig  . aspirin 81 MG tablet Take 81 mg by mouth daily.  Marland Kitchen ibuprofen (ADVIL,MOTRIN) 200 MG tablet Take 200 mg by mouth every 6 (six) hours as needed for pain.  Marland Kitchen LORazepam (ATIVAN) 0.5 MG tablet Take 0.25 mg by mouth 2 (two) times daily as needed for anxiety.   . propranolol ER (INDERAL LA) 60 MG 24 hr capsule Take 60 mg by mouth daily.  . QUEtiapine (SEROQUEL) 100 MG tablet Take 100 mg by mouth at bedtime.   . ranitidine (ZANTAC) 150 MG tablet Take 150 mg by mouth as needed for heartburn.  . sertraline (ZOLOFT) 50 MG tablet Take 50 mg by mouth daily.  . [DISCONTINUED] albuterol (PROVENTIL HFA;VENTOLIN HFA) 108 (90 BASE) MCG/ACT inhaler Inhale 2 puffs into the lungs every 6 (six) hours as needed for wheezing or shortness of breath.  . [DISCONTINUED] meclizine (ANTIVERT) 25 MG tablet Take 25 mg by mouth 3 (three) times daily as  needed.  . [DISCONTINUED] predniSONE (DELTASONE) 50 MG tablet Take 1 tablet (50 mg total) by mouth daily.  . [DISCONTINUED] promethazine (PHENERGAN) 12.5 MG tablet Take 1 tablet (12.5 mg total) by mouth every 6 (six) hours as needed for nausea.   No facility-administered encounter medications on file as of 05/19/2018.   :   Review of Systems:  Out of a complete 14 point review of systems, all are reviewed and negative with the exception of these symptoms as listed below:    Review of Systems  Neurological:  Pt presents today to discuss her tremor. Pt notices the tremor all over her body and mostly in her left hand. Pt is right handed. Her medications have not seemed to help her tremor.    Objective:  Neurological Exam  Physical Exam Physical Examination:   Vitals:   05/19/18 1434  BP: 117/69  Pulse: 74    General Examination: The patient is a very pleasant 67 y.o. female in no acute distress. She appears well-developed and well-nourished and well groomed.   HEENT: Normocephalic, atraumatic, pupils are equal, round and reactive to light and accommodation. She has mild nuchal rigidity. She has trouble relaxing. She has no difficulty with tracking, she has no facial masking, she has a slight voice tremor and an intermittent head tremor. She has no orofacial dyskinesias, tongue movements are normal, airway examination reveals mild to moderate mouth dryness but otherwise nonfocal findings. She has no carotid bruits.  Chest: Clear to auscultation without wheezing, rhonchi or crackles noted.  Heart: S1+S2+0, regular and normal without murmurs, rubs or gallops noted.   Abdomen: Soft, non-tender and non-distended with normal bowel sounds appreciated on auscultation.  Extremities: There is no pitting edema in the distal lower extremities bilaterally. Pedal pulses are intact.  Skin: Warm and dry without trophic changes noted.  Musculoskeletal: exam reveals no obvious joint  deformities, tenderness or joint swelling or erythema, With the exception of right knee pain reported.   Neurologically:  Mental status: The patient is awake, alert and oriented in all 4 spheres. Her immediate and remote memory, attention, language skills and fund of knowledge are appropriate. There is no evidence of aphasia, agnosia, apraxia or anomia. Speech is clear with normal prosody and enunciation. Thought process is linear. Mood is normal and affect is normal.  Cranial nerves II - XII are as described above under HEENT exam. In addition: shoulder shrug is normal with equal shoulder height noted. Motor exam: Normal bulk, strength and tone is noted. There is no resting tremor.  She has a bilateral upper extremity postural and action tremor. Her postural tremor is more pronounced on the left than right. She has mild fine motor dysfunction with finger taps and hand movements bilaterally, slightly worse on the left.  On 05/19/2018: on Archimedes spiral drawing she has no significant difficulty with her right hand which is her dominant hand, with the left hand she has a coarse tremor, handwriting with her right hand is slightly tremulous, legible, not particularly micrographic.  Romberg is negative. Reflexes are 2+ throughout. Fine motor skills and coordination: as above.  Cerebellar testing: No dysmetria but slight intention tremor on finger to nose testing. Heel to shin is unremarkable bilaterally. There is no truncal or gait ataxia.  Sensory exam: intact to light touch in the upper and lower extremities.  Gait, station and balance: She stands easily. No veering to one side is noted. No leaning to one side is noted. Posture is age-appropriate and stance is narrow based. Gait shows slight limp on the right, she has preserved arm swing but walks slightly slowly.  Assessment and plan:  Assessment and Plan:  In summary, Catha GosselinDeborah K Recinos is a very pleasant 67 y.o.-year old female with an underlying  medical history coronary artery disease with status post MI, leg edema, arthritis, reflux disease, anxiety and obesity, who presents for evaluation of her tremor affecting primarily both hands, left more than right but also her head and neck area. Her history and family history are not classic for essential tremor,  findings also suggest lateralization to the left, she does not have classic parkinsonism. Of note, she is on sertraline which can exacerbate tremor. She also endorses quite a bit of stress and anxiety which is likely a contributor to her tremors. She has been on Seroquel which can cause drug-induced parkinsonism in some patients. She has not noticed any significant improvement with a beta blocker. I suggested further workup in the form of brain MRI with and without contrast and a nuclear medicine DaT scan for further diagnostic help. I did not suggest any new medications but advised her to talk to about stress and anxiety management. She is advised to stay better hydrated with water and well rested. I would like to see her back after testing is completed. As far as medications are concerned, I recommended the following at this time: no change.  I answered all their questions today and the patient and her sister were in agreement.  Thank you very much for allowing me to participate in the care of this nice patient. If I can be of any further assistance to you please do not hesitate to call me at [phone].  Sincerely,   Huston Foley, MD, PhD

## 2018-05-19 NOTE — Telephone Encounter (Signed)
[employer] order sent to GI. No auth they will reach out to the pt to schedule.  °

## 2018-05-20 LAB — COMPREHENSIVE METABOLIC PANEL
ALT: 20 IU/L (ref 0–32)
AST: 18 IU/L (ref 0–40)
Albumin/Globulin Ratio: 2 (ref 1.2–2.2)
Albumin: 4.5 g/dL (ref 3.6–4.8)
Alkaline Phosphatase: 106 IU/L (ref 39–117)
BUN/Creatinine Ratio: 14 (ref 12–28)
BUN: 9 mg/dL (ref 8–27)
Bilirubin Total: 0.2 mg/dL (ref 0.0–1.2)
CO2: 22 mmol/L (ref 20–29)
Calcium: 9.7 mg/dL (ref 8.7–10.3)
Chloride: 98 mmol/L (ref 96–106)
Creatinine, Ser: 0.64 mg/dL (ref 0.57–1.00)
GFR, EST AFRICAN AMERICAN: 108 mL/min/{1.73_m2} (ref >59)
GFR, EST NON AFRICAN AMERICAN: 93 mL/min/{1.73_m2} (ref >59)
Globulin, Total: 2.2 g/dL (ref 1.5–4.5)
Glucose: 82 mg/dL (ref 65–99)
Potassium: 4.6 mmol/L (ref 3.5–5.2)
Sodium: 138 mmol/L (ref 134–144)
Total Protein: 6.7 g/dL (ref 6.0–8.5)

## 2018-06-01 ENCOUNTER — Ambulatory Visit
Admission: RE | Admit: 2018-06-01 | Discharge: 2018-06-01 | Disposition: A | Discharge status: Home / Self Care | Payer: Medicare Other | Source: Ambulatory Visit | Attending: Neurology | Admitting: Neurology

## 2018-06-01 DIAGNOSIS — R251 Tremor, unspecified: Secondary | ICD-10-CM

## 2018-06-01 DIAGNOSIS — R498 Other voice and resonance disorders: Secondary | ICD-10-CM

## 2018-06-01 MED ORDER — GADOBENATE DIMEGLUMINE 529 MG/ML IV SOLN
16.0000 mL | Freq: Once | INTRAVENOUS | Status: AC | PRN
  Administered 2018-06-01: 16 mL via INTRAVENOUS

## 2018-06-07 ENCOUNTER — Telehealth: Payer: Self-pay

## 2018-06-07 NOTE — Progress Notes (Signed)
Please call patient regarding the recent brain MRI: The brain scan showed a normal structure of the brain and no significant volume loss which we call atrophy. There were changes in the deeper structures of the brain, which we call white matter changes or microvascular changes. These were reported as mild in Her case. These are tiny white spots, that occur with time and are seen in a variety of conditions, including with normal aging, chronic hypertension, chronic headaches, especially migraine HAs, chronic diabetes, chronic hyperlipidemia. These are not strokes and no mass or lesion or contrast enhancement was seen which is reassuring. Again, there were no acute findings, such as a stroke, or mass or blood products. No further action is required on this test at this time, other than re-enforcing the importance of good blood pressure control, good cholesterol control, good blood sugar control, and weight management. Please remind patient to keep any upcoming appointments (ie DaT scan in Feb) and to call us with any interim questions, concerns, problems or updates. Thanks,  Huston FoleySaima Andreas Sobolewski, MD, PhD

## 2018-06-07 NOTE — Telephone Encounter (Signed)
-----   Message from Huston FoleySaima Athar, MD sent at 06/07/2018 10:39 AM EST ----- Please call patient regarding the recent brain MRI: The brain scan showed a normal structure of the brain and no significant volume loss which we call atrophy. There were changes in the deeper structures of the brain, which we call white matter changes or microvascular changes. These were reported as mild in Her case. These are tiny white spots, that occur with time and are seen in a variety of conditions, including with normal aging, chronic hypertension, chronic headaches, especially migraine HAs, chronic diabetes, chronic hyperlipidemia. These are not strokes and no mass or lesion or contrast enhancement was seen which is reassuring. Again, there were no acute findings, such as a stroke, or mass or blood products. No further action is required on this test at this time, other than re-enforcing the importance of good blood pressure control, good cholesterol control, good blood sugar control, and weight management. Please remind patient to keep any upcoming appointments (ie DaT scan in Feb) and to call us with any interim questions, concerns, problems or updates. Thanks,  Huston FoleySaima Athar, MD, PhD

## 2018-06-07 NOTE — Telephone Encounter (Signed)
Pt called me back. I discussed her MRI results with her. Pt reports that she is trying to lose some weight. Pt will keep DaT scan appt in February. Pt verbalized understanding of results. Pt had no questions at this time but was encouraged to call back if questions arise.

## 2018-06-07 NOTE — Telephone Encounter (Signed)
I called pt to discuss her MRI results. No answer, left a message asking her to call me back. 

## 2018-07-19 ENCOUNTER — Telehealth: Payer: Self-pay | Admitting: Neurology

## 2018-07-19 NOTE — Telephone Encounter (Signed)
Melissa from [facility] states that there is no auth on file for this pts DAT Scan and it is required. Please advise.

## 2018-07-20 NOTE — Telephone Encounter (Signed)
I called and spoke with Melissa and told her we had resubmitted the authorization and it was pending.

## 2018-07-20 NOTE — Telephone Encounter (Signed)
Auth: [id number] (exp. 07/20/18 to 09/03/18) I spoke to [facility] and she is aware.

## 2018-07-22 ENCOUNTER — Encounter (HOSPITAL_COMMUNITY)
Admission: RE | Admit: 2018-07-22 | Discharge: 2018-07-22 | Disposition: A | Discharge status: Home / Self Care | Payer: Medicare Other | Source: Ambulatory Visit | Attending: Neurology | Admitting: Neurology

## 2018-07-22 DIAGNOSIS — F439 Reaction to severe stress, unspecified: Secondary | ICD-10-CM | POA: Insufficient documentation

## 2018-07-22 DIAGNOSIS — R251 Tremor, unspecified: Secondary | ICD-10-CM | POA: Diagnosis present

## 2018-07-22 DIAGNOSIS — R498 Other voice and resonance disorders: Secondary | ICD-10-CM | POA: Insufficient documentation

## 2018-07-22 MED ORDER — IOFLUPANE I 123 185 MBQ/2.5ML IV SOLN
4.4000 | Freq: Once | INTRAVENOUS | Status: AC
  Administered 2018-07-22: 4.4 via INTRAVENOUS

## 2018-07-22 MED ORDER — IODINE STRONG (LUGOLS) 5 % PO SOLN
0.8000 mL | Freq: Three times a day (TID) | ORAL | Status: DC
  Filled 2018-07-22 (×6): qty 0.8

## 2018-07-22 MED ORDER — IODINE STRONG (LUGOLS) 5 % PO SOLN
0.8000 mL | Freq: Once | ORAL | Status: AC
  Administered 2018-07-22: 0.8 mL via ORAL
  Filled 2018-07-22: qty 0.8

## 2018-07-22 NOTE — Progress Notes (Signed)
Please call patient and advise her that her recent nuclear medicine DaT scan of the brain did not support any underlying parkinsonian disorder.  FU as scheduled in March. Janene Harvey

## 2018-07-23 ENCOUNTER — Telehealth: Payer: Self-pay

## 2018-07-23 NOTE — Telephone Encounter (Signed)
-----   Message from Huston Foley, MD sent at 07/22/2018  4:00 PM EST ----- Please call patient and advise her that her recent nuclear medicine DaT scan of the brain did not support any underlying parkinsonian disorder.  FU as scheduled in March. Janene Harvey

## 2018-07-23 NOTE — Telephone Encounter (Signed)
I called pt and advised her of her DaT scan results. Pt will keep her follow up in March. Pt verbalized understanding of results. Pt had no questions at this time but was encouraged to call back if questions arise.

## 2018-09-02 ENCOUNTER — Ambulatory Visit: Payer: Medicare Other | Admitting: Neurology

## 2018-09-02 ENCOUNTER — Other Ambulatory Visit: Payer: Self-pay

## 2018-09-02 VITALS — BP 121/80 | HR 71 | Temp 98.0°F | Ht 65.0 in | Wt 182.5 lb

## 2018-09-02 DIAGNOSIS — G250 Essential tremor: Secondary | ICD-10-CM | POA: Diagnosis not present

## 2018-09-02 MED ORDER — PRIMIDONE 50 MG PO TABS: ORAL_TABLET | ORAL | 5 refills | Status: DC

## 2018-09-02 NOTE — Progress Notes (Signed)
Subjective:    [name] ID: Jillian [name] is a 68 y.o. female.  HPI    Interim history:    Jillian [name] is a 68 year old right-handed woman with an underlying medical history coronary artery disease with status post MI, leg edema, arthritis, reflux disease, anxiety and obesity, who presents for follow-up consultation of her hand tremors. Jillian [name] is unaccompanied today. I first met her on 05/19/2018 at Jillian request of her primary care physician, at which time Jillian [name] reported a bilateral hand tremor for approximately 5 years with progression noted over Jillian past 2 years and more so on Jillian left side. She had noticed a head tremor as well. On examination, she did not have any telltale parkinsonism but did have some lateralization of or tremor to Jillian left, history not classic for essential tremor. I suggested we proceed with further workup in Jillian form of brain MRI and nuclear medicine DaT scan. She reported that she had not noticed much in Jillian way of improvement in her tremor with propranolol. She was on sertraline at Jillian time, she was also on Seroquel.  She had a brain MRI with and without contrast on 06/01/2018, which I reviewed: IMPRESSION: This MRI of Jillian brain with and without contrast shows Jillian following: 1.    Scattered T2/flair hyperintense foci in Jillian subcortical deep white matter of Jillian hemispheres.  This is nonspecific but most consistent with mild chronic microvascular ischemic change.  None of Jillian foci appear to be acute. 2.    There is a normal enhancement pattern and there are no acute findings.  We called her with her test results.  She had a brain SPECT scan, DaT scan on 07/22/2018 and I reviewed Jillian results: IMPRESSION: Normal symmetric activity and shape of Jillian striata. No evidence of loss of dopamine transport populations. Pattern does NOT suggest Parkinson's syndrome type pathology.  We called her with her test results.  Today, 09/02/2018: She reports that she is no  longer on Jillian sertraline, and no longer on Jillian propranolol, she stopped both on her own. She no longer is on lorazepam, she is on clonazepam, 0.5 mg, she take one in late morning. Stress is okay, has cut back on Soda, trying to increase her water intake. She needs cataract surgeries and needs R knee replacement. Tremor is about Jillian same.   Previously:   05/19/2018: (She) reports a bilateral hand tremor for Jillian past 5 years. She first noticed a tremor in 2014 but in Jillian past 2 years it has progressed. It is primarily in her left hand. She has noticed a head tremor as well. She is often embarrassed about her tremor. She has no classic family history of essential tremor. She had a total of 2 sisters and 2 brothers, 1 sister passed away by suicide at age 64. One brother passed away from complications of blood clot. She is divorced, has no children, sister that is with her today lives next door. [name] history of anxiety and stress. She had lost her sister to suicide when [name] was 10 years old and she had a heart attack shortly thereafter. In Jillian recent past, she lost 2 other family members to suicide (sister's daughter and that niece's husband). Her father passed away last year at age 61. Mom is 69 years old.she smokes half pack per day and drinks alcohol occasionally, drinks caffeine daily. She admits that she does not always hydrate well enough. When she first started noticing Jillian tremor she reduced  her caffeine intake. She has not fallen recently but fell last year.  I reviewed your office note from 03/22/2018, which you kindly included.   Of note, she had a head CT without contrast and maxillofacial CT without contrast on 06/30/2017 after a fall: IMPRESSION: Normal head CT.  Moderate right to left nasal septal deviation is noted secondary to concha bullosa, which is normal variant. No acute abnormality seen in Jillian maxillofacial region.   She had blood work through your office on 06/30/2017  and I reviewed Jillian results: CBC with differential was unremarkable, CMP was unremarkable.    She was started on propranolol long-acting in October 2019 but feels it has not helped. Of note, she is on sertraline 50 mg once daily and has been on it for about a year. She is on Seroquel 100 mg at bedtime and has been on it for less than a year.    Her Past Medical History Is Significant For: Past Medical History:  Diagnosis Date  . Adjustment disorder, unspecified   . Arthritis   . Coronary artery disease   . Edema leg   . GAD (generalized anxiety disorder)   . GERD (gastroesophageal reflux disease)   . Myocardial infarction [facility]) 1995   saw dr Claiborne Billings   . PONV (postoperative nausea and vomiting)   . Tremor, unspecified   . Unspecified mood (affective) disorder ([place])   . Vein disorder    lower legs    Her Past Surgical History Is Significant For: Past Surgical History:  Procedure Laterality Date  . COLONOSCOPY W/ POLYPECTOMY  2013  . KNEE ARTHROSCOPY Right 11/23/2012   Procedure: RIGHT KNEE ARTHROSCOPY PARTIAL MEDIAL MENISECTOMY CHRONDROPLASTY;  Surgeon: Hessie Dibble, MD;  Location: [facility];  Service: Orthopedics;  Laterality: Right;  . TUBAL LIGATION  1973  . VEIN SURGERY      Her Family History Is Significant For: No family history on file.  Her Social History Is Significant For: Social History   Socioeconomic History  . Marital status: Divorced    Spouse name: Not on file  . Number of children: Not on file  . Years of education: Not on file  . Highest education level: Not on file  Occupational History  . Not on file  Social Needs  . Financial resource strain: Not on file  . Food insecurity:    Worry: Not on file    Inability: Not on file  . Transportation needs:    Medical: Not on file    Non-medical: Not on file  Tobacco Use  . Smoking status: Current Every Day Smoker    Packs/day: 0.50  . Smokeless tobacco: Never Used  Substance and  Sexual Activity  . Alcohol use: Yes    Comment: rare  . Drug use: No  . Sexual activity: Not on file  Lifestyle  . Physical activity:    Days per week: Not on file    Minutes per session: Not on file  . Stress: Not on file  Relationships  . Social connections:    Talks on phone: Not on file    Gets together: Not on file    Attends religious service: Not on file    Active member of club or organization: Not on file    Attends meetings of clubs or organizations: Not on file    Relationship status: Not on file  Other Topics Concern  . Not on file  Social History Narrative  . Not on file  Her Allergies Are:  Allergies  Allergen Reactions  . Hydrocodone Nausea And Vomiting  . Opium Nausea And Vomiting    [name] states all narcotics make her N/V  :   Her Current Medications Are:  Outpatient Encounter Medications as of 09/02/2018  Medication Sig  . aspirin 81 MG tablet Take 81 mg by mouth daily.  . clonazePAM (KLONOPIN) 0.5 MG tablet Take 0.5 mg by mouth daily as needed for anxiety.  Marland Kitchen ibuprofen (ADVIL,MOTRIN) 200 MG tablet Take 200 mg by mouth every 6 (six) hours as needed for pain.  Marland Kitchen QUEtiapine (SEROQUEL) 100 MG tablet Take 100 mg by mouth at bedtime.   . [DISCONTINUED] LORazepam (ATIVAN) 0.5 MG tablet Take 0.25 mg by mouth 2 (two) times daily as needed for anxiety.   . [DISCONTINUED] propranolol ER (INDERAL LA) 60 MG 24 hr capsule Take 60 mg by mouth daily.  . [DISCONTINUED] ranitidine (ZANTAC) 150 MG tablet Take 150 mg by mouth as needed for heartburn.  . [DISCONTINUED] sertraline (ZOLOFT) 50 MG tablet Take 50 mg by mouth daily.   No facility-administered encounter medications on file as of 09/02/2018.   :  Review of Systems:  Out of a complete 14 point review of systems, all are reviewed and negative with Jillian exception of these symptoms as listed below: Review of Systems  Neurological:       Jillian [name] 1 with sister. Last seen 05/19/18. Here to f/u on her tremors. Still  having tremors. Some days are better than others.     Objective:  Neurological Exam  Physical Exam Physical Examination:   Vitals:   09/02/18 1346  BP: 121/80  Pulse: 71  Temp: 98 F (36.7 C)   General Examination: Jillian [name] is a very pleasant 68 y.o. female in no acute distress. She appears well-developed and well-nourished and well groomed.   HEENT: Normocephalic, atraumatic, pupils are equal, round and reactive to light and accommodation. She still has mild nuchal rigidity, but has trouble relaxing. She has no difficulty with tracking, she has no facial masking, she has a mild voice tremor and an intermittent head tremor. She has no orofacial dyskinesias, tongue movements are normal, airway examination reveals mild to moderate mouth dryness but otherwise nonfocal findings. She has no carotid bruits.  Chest: Clear to auscultation without wheezing, rhonchi or crackles noted.  Heart: S1+S2+0, regular and normal without murmurs, rubs or gallops noted.   Abdomen: Soft, non-tender and non-distended with normal bowel sounds appreciated on auscultation.  Extremities: There is no pitting edema in Jillian distal lower extremities bilaterally. Pedal pulses are intact.  Skin: Warm and dry without trophic changes noted.  Musculoskeletal: exam reveals no obvious joint deformities, tenderness or joint swelling or erythema, with Jillian exception of right knee pain reported.   Neurologically:  Mental status: Jillian [name] is awake, alert and oriented in all 4 spheres. Her immediate and remote memory, attention, language skills and fund of knowledge are appropriate. There is no evidence of aphasia, agnosia, apraxia or anomia. Speech is clear with normal prosody and enunciation. Thought process is linear. Mood is normal and affect is normal.  Cranial nerves II - XII are as described above under HEENT exam. In addition: shoulder shrug is normal with equal shoulder height noted. Motor exam: Normal  bulk, strength and tone is noted. There is no resting tremor.   She has a bilateral upper extremity postural and action tremor. Her postural tremor is more pronounced on Jillian left than right, stable from last time.  She has mild fine motor dysfunction with finger taps and hand movements bilaterally, slightly worse on Jillian left, also stable.  (On 05/19/2018: on Archimedes spiral drawing she has no significant difficulty with her right hand which is her dominant hand, with Jillian left hand she has a coarse tremor, handwriting with her right hand is slightly tremulous, legible, not particularly micrographic.)  Romberg is negative. Reflexes are 1-2+ throughout. Fine motor skills and coordination: as above.  Cerebellar testing: No dysmetria but slight intention tremor on finger to nose testing. There is no truncal or gait ataxia.  Sensory exam: intact to light touch in Jillian upper and lower extremities.  Gait, station and balance: She stands easily. No veering to one side is noted. No leaning to one side is noted. Posture is age-appropriate and stance is narrow based. Gait shows slight limp on Jillian right, again noted today, but stable, she has preserved arm swing but walks slightly slowly.  Assessment and plan:   In summary, Jillian [name] is a very pleasant 68 year old female with an underlying medical history coronary artery disease with status post MI, leg edema, arthritis, reflux disease, anxiety and obesity, who presents for FU consultation of her hand tremors. She recently stopped her propranolol and sertraline She is on clonazepam, no longer on lorazepam. She had a recent nuclear medicine brain SPECT scan, called DaT scan which did not support an underlying parkinsonian disorder. She does not have any telltale parkinsonism, has some lateralization of her tremor on Jillian left. She continues to take Seroquel. While she does not have a family history of essential tremor, given that she does have a head tremor,  voice tremor and hand tremor, her diagnosis would likely be in keeping with essential tremor. I talked to her at length about this. She does report stress and anxiety in her life, particularly secondary to losing a family member to suicide. She had not noticed any significant improvement with Propranolol. Nevertheless, I think it may be worthwhile trying primidone. I talked her about Mysoline/primidone, its side effect profile and caution. We talked about expectations with his medication. She is advised that it can cause balance issues and sedation and we will start really low and titrate slowly. I think ongoing stress and anxiety management will be important for her and she was advised of this again today. To that end, I will start primidone 50 mg strength half a pill at bedtime with gradual increase, she was given detailed written instructions and a new prescription. I would like to see her back in 6 months, sooner if needed. She is strongly encouraged to call or email [place] through my chart with any interim questions or concerns. I answered all her questions today and Jillian [name] was in agreement.  I spent 25 minutes in total face-to-face time with Jillian [name], more than 50% of which was spent in counseling and coordination of care, reviewing test results, reviewing medication and discussing or reviewing Jillian diagnosis of ET, its prognosis and treatment options. Pertinent laboratory and imaging test results that were available during this visit with Jillian [name] were reviewed by me and considered in my medical decision making (see chart for details).

## 2018-09-02 NOTE — Patient Instructions (Signed)
Your exam is stable.  Your DaT scan does not support Parkinsonism.  Your tremor may improve with a trial of Mysoline (gneric name is primidone) 50 mg strength: Take 1/2 pill each bedtime for 2 weeks, then 1 pill each bedtime for 2 weeks, then 1 1/2 pills each bedtime for 2 weeks, then 2 pills each bedtime thereafter. Common side effects reported are: Sleepiness, drowsiness, balance problems, confusion, and GI related symptoms.

## 2018-09-03 ENCOUNTER — Telehealth: Payer: Self-pay

## 2018-09-03 NOTE — Telephone Encounter (Signed)
Pt returned my call and I discussed with her Dr. Teofilo Pod recommendations regarding the seroquel and primidone. Pt is agreeable to speaking with Dr. Hal Hope about stopping seroquel and then calling us back when she comes off of it. Pt will hold off on the primidone.  I called [facility], spoke with Trinna Post and advised him to hold off of on the primidone. He verbalized understanding.

## 2018-09-03 NOTE — Telephone Encounter (Signed)
Received notice from [facility] that primidone interacts with quetiapine causing a decreased effect of quetiapine. Pharmacy needs advisement if MD is aware of this before filling.

## 2018-09-03 NOTE — Telephone Encounter (Signed)
I was not aware of an interaction between primidone and Seroquel. It did not flag it when I prescribed it either. At any rate, I would suggest we hold off on the prescription for now, please notify patient that I have suggested we hold off on the primidone prescription because of a potential interaction and decreased efficacy of Seroquel. If, in the future she is able to come off the Seroquel, we can try primidone at that time.

## 2018-09-03 NOTE — Telephone Encounter (Signed)
I called pt to discuss her primidone. No answer, left a message asking her to call me back.

## 2018-09-28 ENCOUNTER — Other Ambulatory Visit: Payer: Self-pay

## 2018-09-28 ENCOUNTER — Encounter: Payer: Self-pay | Admitting: Sports Medicine

## 2018-09-28 ENCOUNTER — Ambulatory Visit (INDEPENDENT_AMBULATORY_CARE_PROVIDER_SITE_OTHER): Payer: Medicare Other

## 2018-09-28 ENCOUNTER — Ambulatory Visit: Payer: Medicare Other | Admitting: Sports Medicine

## 2018-09-28 VITALS — BP 117/74 | HR 80 | Temp 98.4°F | Resp 16

## 2018-09-28 DIAGNOSIS — M7989 Other specified soft tissue disorders: Secondary | ICD-10-CM | POA: Diagnosis not present

## 2018-09-28 DIAGNOSIS — M7752 Other enthesopathy of left foot: Secondary | ICD-10-CM | POA: Diagnosis not present

## 2018-09-28 DIAGNOSIS — M79675 Pain in left toe(s): Secondary | ICD-10-CM

## 2018-09-28 DIAGNOSIS — S92525A Nondisplaced fracture of medial phalanx of left lesser toe(s), initial encounter for closed fracture: Secondary | ICD-10-CM

## 2018-09-28 DIAGNOSIS — L853 Xerosis cutis: Secondary | ICD-10-CM

## 2018-09-28 NOTE — Patient Instructions (Signed)
[place] Healthy Feet for dry callus heels/skin  Coban or Self adhereant wrap to toes for fracture

## 2018-09-28 NOTE — Progress Notes (Signed)
Subjective: Jillian [name] is a 68 y.o. female patient who presents to office for evaluation of Left foot pain. Patient complains of progressive pain especially over the last 3 weeks in the Left foot at the 3rd toe that is slowly getting better. Admits pain after stumping toe. Patient has tried taping toes with some relief in symptoms. Patient denies any other pedal complaints.   Review of Systems  All other systems reviewed and are negative.    Patient Active Problem List   Diagnosis Date Noted  . Closed fracture of left patella 06/30/2017  . Pre-diabetes 12/20/2015  . Generalized anxiety disorder 09/20/2015  . Major depressive disorder, recurrent episode, moderate (HCC) 09/20/2015  . Venous insufficiency (chronic) (peripheral) 09/12/2015  . Anxiety 09/04/2015  . Insomnia 09/04/2015  . 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 Gastroesophageal reflux disease without esophagitis 05/22/2015  . Osteopenia 05/22/2015  . Panic attacks 05/22/2015  . Recurrent major depressive disorder (HCC) 05/22/2015  . Tremor 05/22/2015  . History of DVT of lower extremity 07/23/2012  . LABYRINTHITIS 11/17/2007  . HEADACHE 11/17/2007    Current Outpatient Medications on File Prior to Visit  Medication Sig Dispense Refill  . clonazePAM (KLONOPIN) 0.5 MG tablet Take 0.5 mg by mouth daily as needed for anxiety.    Marland Kitchen QUEtiapine (SEROQUEL) 100 MG tablet Take 100 mg by mouth at bedtime.      No current facility-administered medications on file prior to visit.     Allergies  Allergen Reactions  . Hydrocodone Nausea And Vomiting  . Opium Nausea And Vomiting    Patient states all narcotics make her N/V    Objective:  General: Alert and oriented x3 in no acute distress  Dermatology: No open lesions bilateral lower extremities, Dry skin to heels, no webspace macerations, no ecchymosis bilateral, all nails x 10 are well manicured.  Vascular: Focal edema noted to left  foot. Dorsalis Pedis and Posterior Tibial pedal pulses 2/4, Capillary Fill Time 3 seconds,(+) pedal hair growth bilateral, Temperature gradient within normal limits.  Neurology: Gross sensation intact via light touch bilateral.  Musculoskeletal: There is tenderness with palpation at 3rd toe on left, All joint range of motion is within normal limits with mild guarding at 3rd toe on left, Strength within normal limits in all groups bilateral.   Gait: Unassisted, Minimally antalgic gait  Xrays  Left Foot   Impression:nondisplaced incomplete fracture of middle phalanx of left 3rd toe with swelling    Assessment and Plan: Problem List Items Addressed This Visit    None    Visit Diagnoses    Closed nondisplaced fracture of middle phalanx of lesser toe of left foot, initial encounter    -  Primary   Relevant Orders   DG Foot Complete Left (Completed)   Pain and swelling of toe, left       Dry skin           -Complete examination performed -Xrays reviewed -Discussed treatement options for fracture; risks, alternatives, and benefits explained. -Applied Left 3rd Toe splint  -Dispensed post op shoe to patient to wear at all times and instructed on use -Recommend protection, rest, ice, elevation daily until symptoms improve -Continue with Tylenol PRN -Recommend [place] Healthy feet cream for dry skin -Patient to return to office in 5-6 weeks for serial x-rays to assess healing  or sooner if condition worsens.  Asencion Islam, DPM

## 2018-11-02 ENCOUNTER — Other Ambulatory Visit: Payer: Self-pay

## 2018-11-02 ENCOUNTER — Ambulatory Visit: Payer: Medicare Other | Admitting: Sports Medicine

## 2018-11-02 ENCOUNTER — Encounter: Payer: Self-pay | Admitting: Sports Medicine

## 2018-11-02 ENCOUNTER — Ambulatory Visit (INDEPENDENT_AMBULATORY_CARE_PROVIDER_SITE_OTHER): Payer: Medicare Other

## 2018-11-02 VITALS — Temp 97.2°F

## 2018-11-02 DIAGNOSIS — S92525A Nondisplaced fracture of medial phalanx of left lesser toe(s), initial encounter for closed fracture: Secondary | ICD-10-CM | POA: Diagnosis not present

## 2018-11-02 DIAGNOSIS — M79675 Pain in left toe(s): Secondary | ICD-10-CM

## 2018-11-02 DIAGNOSIS — S92525D Nondisplaced fracture of medial phalanx of left lesser toe(s), subsequent encounter for fracture with routine healing: Secondary | ICD-10-CM | POA: Diagnosis not present

## 2018-11-02 DIAGNOSIS — M7989 Other specified soft tissue disorders: Secondary | ICD-10-CM

## 2018-11-02 NOTE — Progress Notes (Signed)
Subjective: Jillian [name] is a 68 y.o. female patient who returns to office for follow up evaluation of left 3rd toe fracture. Patient reports swelling and soreness on left 3rd and also 4th toe. Reports pain is most present when trying to wear certain shoes. Patient denies nausea, vomiting, fever, chills, warmth, redness or irritation to toes especially with splint.     Patient Active Problem List   Diagnosis Date Noted  . Closed fracture of left patella 06/30/2017  . Pre-diabetes 12/20/2015  . Generalized anxiety disorder 09/20/2015  . Major depressive disorder, recurrent episode, moderate (HCC) 09/20/2015  . Venous insufficiency (chronic) (peripheral) 09/12/2015  . Anxiety 09/04/2015  . Insomnia 09/04/2015  . 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 Gastroesophageal reflux disease without esophagitis 05/22/2015  . Osteopenia 05/22/2015  . Panic attacks 05/22/2015  . Recurrent major depressive disorder (HCC) 05/22/2015  . Tremor 05/22/2015  . History of DVT of lower extremity 07/23/2012  . LABYRINTHITIS 11/17/2007  . HEADACHE 11/17/2007    Current Outpatient Medications on File Prior to Visit  Medication Sig Dispense Refill  . clonazePAM (KLONOPIN) 0.5 MG tablet Take 0.5 mg by mouth daily as needed for anxiety.    Marland Kitchen QUEtiapine (SEROQUEL) 100 MG tablet Take 100 mg by mouth at bedtime.      No current facility-administered medications on file prior to visit.     Allergies  Allergen Reactions  . Hydrocodone Nausea And Vomiting  . Opium Nausea And Vomiting    Patient states all narcotics make her N/V    Objective:  General: Alert and oriented x3 in no acute distress  Dermatology: No open lesions bilateral lower extremities, Dry skin to heels, no webspace macerations, no ecchymosis bilateral, all nails x 10 are well manicured.  Vascular: Focal edema noted to left foot. Dorsalis Pedis and Posterior Tibial pedal pulses 2/4, Capillary Fill Time 3  seconds,(+) pedal hair growth bilateral, Temperature gradient within normal limits.  Neurology: Gross sensation intact via light touch bilateral.  Musculoskeletal: There is minimal tenderness with palpation at 3rd toe on left. Subjective soreness to left 4th toe with varus rotation, All joint range of motion is within normal limits with mild guarding at 3rd toe on left, Strength within normal limits in all groups bilateral.   Xrays  Left Foot   Impression:nondisplaced incomplete fracture of middle phalanx of left 3rd toe with swelling that appears 75% healed. Mild hammertoe, no other acute findings.     Assessment and Plan: Problem List Items Addressed This Visit    None    Visit Diagnoses    Closed nondisplaced fracture of middle phalanx of lesser toe of left foot with routine healing, subsequent encounter    -  Primary   Relevant Orders   DG Foot Complete Left (Completed)   Pain and swelling of toe, left           -Complete examination performed -Xrays reviewed -Re-Discussed treatement options for fracture; risks, alternatives, and benefits explained. -Applied Left 3rd Toe splint and instructed patient to splint 2-4 toes to prevent her 4th hammertoe from tucking under 3rd toe -Advised comfortable shoes or return to post op shoe for toe fracture  -Recommend protection, rest, ice, elevation daily until symptoms improve -Patient to return to office in 4-6 weeks for serial x-rays to assess healing  or sooner if condition worsens.  Asencion Islam, DPM

## 2018-12-14 ENCOUNTER — Ambulatory Visit: Payer: Medicare Other | Admitting: Sports Medicine

## 2018-12-14 ENCOUNTER — Other Ambulatory Visit: Payer: Self-pay

## 2018-12-14 ENCOUNTER — Encounter: Payer: Self-pay | Admitting: Sports Medicine

## 2018-12-14 ENCOUNTER — Ambulatory Visit (INDEPENDENT_AMBULATORY_CARE_PROVIDER_SITE_OTHER): Payer: Medicare Other

## 2018-12-14 VITALS — Temp 98.0°F

## 2018-12-14 DIAGNOSIS — S92525D Nondisplaced fracture of medial phalanx of left lesser toe(s), subsequent encounter for fracture with routine healing: Secondary | ICD-10-CM

## 2018-12-14 DIAGNOSIS — M79675 Pain in left toe(s): Secondary | ICD-10-CM | POA: Diagnosis not present

## 2018-12-14 DIAGNOSIS — M7989 Other specified soft tissue disorders: Secondary | ICD-10-CM

## 2018-12-14 NOTE — Progress Notes (Signed)
Subjective: Jillian [name] is a 68 y.o. female patient who returns to office for follow up evaluation of left 3rd toe fracture. Patient reports no pain to toe. Patient denies nausea, vomiting, fever, chills, warmth, redness or irritation to toes. Reports that toes feel fine and is in normal shoe without issues.    Patient Active Problem List   Diagnosis Date Noted  . Closed fracture of left patella 06/30/2017  . Pre-diabetes 12/20/2015  . Generalized anxiety disorder 09/20/2015  . Major depressive disorder, recurrent episode, moderate ([place]) 09/20/2015  . Venous insufficiency (chronic) (peripheral) 09/12/2015  . Anxiety 09/04/2015  . Insomnia 09/04/2015  . 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 Gastroesophageal reflux disease without esophagitis 05/22/2015  . Osteopenia 05/22/2015  . Panic attacks 05/22/2015  . Recurrent major depressive disorder ([place]) 05/22/2015  . Tremor 05/22/2015  . History of DVT of lower extremity 07/23/2012  . LABYRINTHITIS 11/17/2007  . HEADACHE 11/17/2007    Current Outpatient Medications on File Prior to Visit  Medication Sig Dispense Refill  . clonazePAM (KLONOPIN) 0.5 MG tablet Take 0.5 mg by mouth daily as needed for anxiety.    Marland Kitchen QUEtiapine (SEROQUEL) 100 MG tablet Take 100 mg by mouth at bedtime.      No current facility-administered medications on file prior to visit.     Allergies  Allergen Reactions  . Hydrocodone Nausea And Vomiting  . Opium Nausea And Vomiting    Patient states all narcotics make her N/V    Objective:  General: Alert and oriented x3 in no acute distress  Dermatology: No open lesions bilateral lower extremities, Dry skin to heels, no webspace macerations, no ecchymosis bilateral, all nails x 10 are well manicured.  Vascular: Focal edema noted to left foot. Dorsalis Pedis and Posterior Tibial pedal pulses 2/4, Capillary Fill Time 3 seconds,(+) pedal hair growth bilateral, Temperature  gradient within normal limits.  Neurology: Gross sensation intact via light touch bilateral.  Musculoskeletal: There no tenderness with palpation at 3rd toe on left. Subjective soreness to left 4th toe with varus rotation, All joint range of motion is within normal limits with mild guarding at 3rd toe on left, Strength within normal limits in all groups bilateral.   Xrays  Left Foot   Impression:nondisplaced incomplete fracture of middle phalanx of left 3rd toe with swelling that appears 100% healed. Mild hammertoe, no other acute findings.     Assessment and Plan: Problem List Items Addressed This Visit    None    Visit Diagnoses    Closed nondisplaced fracture of middle phalanx of lesser toe of left foot with routine healing, subsequent encounter    -  Primary   healed   Relevant Orders   DG Foot Complete Left   Pain and swelling of toe, left          -Complete examination performed -Xrays reviewed -Discussed long term care for now healed left 3rd toe fracture -Advised comfortable shoes and activities to tolerance  -Patient to return to office PRN or sooner if condition worsens.  Landis Martins, DPM

## 2019-03-07 ENCOUNTER — Ambulatory Visit: Payer: Medicare Other | Admitting: Neurology

## 2020-03-22 ENCOUNTER — Other Ambulatory Visit: Payer: Self-pay

## 2020-03-22 ENCOUNTER — Encounter: Payer: Self-pay | Admitting: Sports Medicine

## 2020-03-22 ENCOUNTER — Ambulatory Visit: Payer: Medicare Other | Admitting: Sports Medicine

## 2020-03-22 ENCOUNTER — Ambulatory Visit (INDEPENDENT_AMBULATORY_CARE_PROVIDER_SITE_OTHER): Payer: Medicare Other

## 2020-03-22 DIAGNOSIS — M79671 Pain in right foot: Secondary | ICD-10-CM | POA: Diagnosis not present

## 2020-03-22 DIAGNOSIS — S90851A Superficial foreign body, right foot, initial encounter: Secondary | ICD-10-CM

## 2020-03-22 DIAGNOSIS — S90819A Abrasion, unspecified foot, initial encounter: Secondary | ICD-10-CM | POA: Diagnosis not present

## 2020-03-22 DIAGNOSIS — S99921A Unspecified injury of right foot, initial encounter: Secondary | ICD-10-CM

## 2020-03-22 MED ORDER — AMOXICILLIN-POT CLAVULANATE 875-125 MG PO TABS: 1.0000 | ORAL_TABLET | Freq: Two times a day (BID) | ORAL | 0 refills | Status: DC

## 2020-03-22 NOTE — Progress Notes (Signed)
Subjective: Jillian [name] is a 69 y.o. female patient who presents to office for evaluation of right foot pain. Patient complains of progressive pain especially since 2 weeks ago after a limb lopper fell on her right foot. Reports that it is sore and has been red. Reports that she has not done any treatment but is concerned because still hurts and does not look like its healing. Patient denies any other pedal complaints.   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     propranolol ER (INDERAL LA) 60 MG 24 hr capsule Take 60 mg by mouth daily.     QUEtiapine (SEROQUEL) 100 MG tablet Take 100 mg by mouth at bedtime.      No current facility-administered medications on file prior to visit.    Allergies  Allergen Reactions   Hydrocodone Nausea And Vomiting   Opium Nausea And Vomiting    Patient states all narcotics make her N/V    Objective:  General: Alert and oriented x3 in no acute distress  Dermatology: Abrasion to right hallux with no signs of infection,  no webspace macerations, no ecchymosis bilateral, all nails x  10 are well manicured.  Vascular: Dorsalis Pedis and Posterior Tibial pedal pulses palpable, Capillary Fill Time 3 seconds,(+) pedal hair growth bilateral, no edema bilateral lower extremities, Temperature gradient within normal limits.  Neurology: Gross sensation intact via light touch bilateral,   Musculoskeletal: Mild tenderness with palpation at right 1st toe.   Gait: Antalgic gait  Xrays  Right Foot   Impression: negative for foregin body   Assessment and Plan: Problem List Items Addressed This Visit    None    Visit Diagnoses    Foreign body in right foot, initial encounter    -  Primary   Relevant Orders   DG Foot Complete Right   Injury of right foot, initial encounter       Abrasion, foot w/o infection       Right foot pain           -Complete examination performed -Xrays reviewed -Discussed treatement options for abrsaion -Rx Augmentin  -Advised patient to apply antibiotic cream to abrasion and bandaid daily  -Patient to return to office in 2 weeks or sooner if condition worsens.  Asencion Islam, DPM

## 2020-04-05 ENCOUNTER — Encounter: Payer: Self-pay | Admitting: Sports Medicine

## 2020-04-05 ENCOUNTER — Ambulatory Visit: Payer: Medicare Other | Admitting: Sports Medicine

## 2020-04-05 ENCOUNTER — Other Ambulatory Visit: Payer: Self-pay

## 2020-04-05 DIAGNOSIS — S90819A Abrasion, unspecified foot, initial encounter: Secondary | ICD-10-CM

## 2020-04-05 DIAGNOSIS — M79671 Pain in right foot: Secondary | ICD-10-CM | POA: Diagnosis not present

## 2020-04-05 NOTE — Progress Notes (Signed)
Subjective: Jillian [name] is a 69 y.o. female patient who presents to office for follow up evaluation of right foot abrasion secondary to dropping a gardening tool. Reports area has healed well and no longer hurts. Reports that she took all antibiotics and denies any constitutional symptoms.   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 28 tablet 0   clonazePAM (KLONOPIN) 0.5 MG tablet Take 0.5 mg by mouth daily as needed for anxiety.     propranolol ER (INDERAL LA) 60 MG 24 hr capsule Take 60 mg by mouth daily.     QUEtiapine (SEROQUEL) 100 MG tablet Take 100 mg by mouth at bedtime.      No current facility-administered medications on file prior to visit.    Allergies  Allergen Reactions   Hydrocodone Nausea And Vomiting   Opium Nausea And Vomiting    Patient states all narcotics make her N/V    Objective:  General: Alert and oriented x3 in no acute distress  Dermatology: Abrasion to right hallux now healed with no signs of infection,  no webspace macerations, no ecchymosis bilateral, all nails x 10 are well  manicured.  Vascular: Dorsalis Pedis and Posterior Tibial pedal pulses palpable, Capillary Fill Time 3 seconds,(+) pedal hair growth bilateral, no edema bilateral lower extremities, Temperature gradient within normal limits.  Neurology: Gross sensation intact via light touch bilateral,   Musculoskeletal: No tenderness to palpation to right 1st toe.   Assessment and Plan: Problem List Items Addressed This Visit    None    Visit Diagnoses    Abrasion, foot w/o infection    -  Primary   Right foot pain           -Complete examination performed -Abrasion is healed -May d/c antibiotic cream and bandaid -May increase activities to tolerance -Advised to wear shoes that do not rub -Return PRN or sooner if problems arise.  Asencion Islam, DPM

## 2020-10-18 ENCOUNTER — Ambulatory Visit (INDEPENDENT_AMBULATORY_CARE_PROVIDER_SITE_OTHER): Payer: Medicare Other | Admitting: Neurology

## 2020-10-18 ENCOUNTER — Encounter: Payer: Self-pay | Admitting: Neurology

## 2020-10-18 VITALS — BP 118/80 | HR 61 | Ht 64.0 in | Wt 164.0 lb

## 2020-10-18 DIAGNOSIS — R42 Dizziness and giddiness: Secondary | ICD-10-CM | POA: Diagnosis not present

## 2020-10-18 DIAGNOSIS — R251 Tremor, unspecified: Secondary | ICD-10-CM

## 2020-10-18 DIAGNOSIS — R2689 Other abnormalities of gait and mobility: Secondary | ICD-10-CM | POA: Diagnosis not present

## 2020-10-18 NOTE — Progress Notes (Signed)
Subjective:    Patient ID: Jillian [name] is a 70 y.o. female.  HPI     Interim history:   Jillian [name] is a 70 year old right-handed woman with an underlying medical history coronary artery disease with status post MI, leg edema, arthritis, reflux disease, anxiety and obesity, who presents for follow-up consultation of her hand tremors. The patient is unaccompanied today and presents after a longer gap of over 2 years.  I last saw her on 09/02/2018, at which time she reported not taking sertraline any longer.  She felt that the tremor was about the same.  I suggested a trial of Jillian [name].  Her pharmacy advised of a potential interaction between Jillian [name] and Jillian [name].  Patient was contacted therefore and advised to hold off the Jillian [name] and the pharmacist was also advised to cancel the prescription for Jillian [name] until patient was able to discuss the Jillian [name] prescription with her primary care.   Today, 10/18/2020: She reports that her tremor is about the same.  She is still on Jillian [name] 100 mg at bedtime.  She reports occasional dizziness.  She has not fallen recently.  She has seen ENT for vertigo.  She had physical therapy.  She does not use a cane or walker.  She does report ongoing stress.  She reports issues with right knee pain, she will need a right knee replacement eventually.  She reports that her balance is not very good.  The patient's allergies, current medications, family history, past medical history, past social history, past surgical history and problem list were reviewed and updated as appropriate.   Previously:    I first met her on 05/19/2018 at the request of her primary care physician, at which time the patient reported a bilateral hand tremor for approximately 5 years with progression noted over the past 2 years and more so on the left side. She had noticed a head tremor as well. On examination, she did not have any telltale parkinsonism but did have some lateralization of or tremor  to the left, history not classic for essential tremor. I suggested we proceed with further workup in the form of brain MRI and nuclear medicine DaT scan. She reported that she had not noticed much in the way of improvement in her tremor with propranolol. She was on sertraline at the time, she was also on Jillian [name].   She had a brain MRI with and without contrast on 06/01/2018, which I reviewed: IMPRESSION: This MRI of the brain with and without contrast shows the following: 1.    Scattered T2/flair hyperintense foci in the subcortical deep white matter of the hemispheres.  This is nonspecific but most consistent with mild chronic microvascular ischemic change.  None of the foci appear to be acute. 2.    There is a normal enhancement pattern and there are no acute findings.   We called her with her test results.   She had a brain SPECT scan, DaT scan on 07/22/2018 and I reviewed the results: IMPRESSION: Normal symmetric activity and shape of the striata. No evidence of loss of dopamine transport populations. Pattern does NOT suggest Parkinson's syndrome type pathology.   We called her with her test results.     05/19/2018: (She) reports a bilateral hand tremor for the past 5 years. She first noticed a tremor in 2014 but in the past 2 years it has progressed. It is primarily in her left hand. She has noticed a head tremor as well. She is often embarrassed about her  tremor. She has no classic family history of essential tremor. She had a total of 2 sisters and 2 brothers, 1 sister passed away by suicide at age 25. One brother passed away from complications of blood clot. She is divorced, has no children, sister that is with her today lives next door. Patient endorses history of anxiety and stress. She had lost her sister to suicide when patient was 26 years old and she had a heart attack shortly thereafter. In the recent past, she lost 2 other family members to suicide (sister's daughter and that niece's  husband). Her father passed away last year at age 77. Mom is 10 years old.she smokes half pack per day and drinks alcohol occasionally, drinks caffeine daily. She admits that she does not always hydrate well enough. When she first started noticing the tremor she reduced her caffeine intake. She has not fallen recently but fell last year.  I reviewed your office note from 03/22/2018, which you kindly included.   Of note, she had a head CT without contrast and maxillofacial CT without contrast on 06/30/2017 after a fall: IMPRESSION: Normal head CT.  Moderate right to left nasal septal deviation is noted secondary to concha bullosa, which is normal variant. No acute abnormality seen in the maxillofacial region.   She had blood work through your office on 06/30/2017 and I reviewed the results: CBC with differential was unremarkable, CMP was unremarkable.    She was started on propranolol long-acting in October 2019 but feels it has not helped. Of note, she is on sertraline 50 mg once daily and has been on it for about a year. She is on Jillian [name] 100 mg at bedtime and has been on it for less than a year.   Her Past Medical History Is Significant For: Past Medical History:  Diagnosis Date  . Adjustment disorder, unspecified   . Arthritis   . Coronary artery disease   . Edema leg   . GAD (generalized anxiety disorder)   . GERD (gastroesophageal reflux disease)   . Myocardial infarction [facility]) 1995   saw dr Claiborne Billings   . PONV (postoperative nausea and vomiting)   . Tremor, unspecified   . Unspecified mood (affective) disorder ([place])   . Vein disorder    lower legs    Her Past Surgical History Is Significant For: Past Surgical History:  Procedure Laterality Date  . COLONOSCOPY W/ POLYPECTOMY  2013  . KNEE ARTHROSCOPY Right 11/23/2012   Procedure: RIGHT KNEE ARTHROSCOPY PARTIAL MEDIAL MENISECTOMY CHRONDROPLASTY;  Surgeon: Hessie Dibble, MD;  Location: [facility];  Service:  Orthopedics;  Laterality: Right;  . TUBAL LIGATION  1973  . VEIN SURGERY      Her Family History Is Significant For: History reviewed. No pertinent family history.  Her Social History Is Significant For: Social History   Socioeconomic History  . Marital status: Divorced    Spouse name: Not on file  . Number of children: Not on file  . Years of education: Not on file  . Highest education level: Not on file  Occupational History  . Not on file  Tobacco Use  . Smoking status: Current Every Day Smoker    Packs/day: 0.50  . Smokeless tobacco: Never Used  Substance and Sexual Activity  . Alcohol use: Yes    Comment: rare  . Drug use: No  . Sexual activity: Not on file  Other Topics Concern  . Not on file  Social History Narrative  . Not  on file   Social Determinants of Health   Financial Resource Strain: Not on file  Food Insecurity: Not on file  Transportation Needs: Not on file  Physical Activity: Not on file  Stress: Not on file  Social Connections: Not on file    Her Allergies Are:  Allergies  Allergen Reactions  . Hydrocodone Nausea And Vomiting  . Opium Nausea And Vomiting    Patient states all narcotics make her N/V  :   Her Current Medications Are:  Outpatient Encounter Medications as of 10/18/2020  Medication Sig  . clonazePAM (KLONOPIN) 0.5 MG tablet Take 0.5 mg by mouth daily as needed for anxiety.  . meclizine (ANTIVERT) 25 MG tablet Take 25 mg by mouth 3 (three) times daily as needed for dizziness.  . propranolol ER (INDERAL LA) 60 MG 24 hr capsule Take 60 mg by mouth daily.  . QUEtiapine (Jillian [name]) 100 MG tablet Take 100 mg by mouth at bedtime.   . [DISCONTINUED] amoxicillin-clavulanate (AUGMENTIN) 875-125 MG tablet Take 1 tablet by mouth 2 (two) times daily.   No facility-administered encounter medications on file as of 10/18/2020.  :  Review of Systems:  Out of a complete 14 point review of systems, all are reviewed and negative with the exception  of these symptoms as listed below: Review of Systems  Neurological:       Here for f/u on tremor. Reports she has been stable, but now is having issues with dizzy spells. Pt was advised by OBGYN to see the neurologist for this issue. Her PCP has rx'd meclizine and has not helped.     Objective:  Neurological Exam  Physical Exam Physical Examination:   Vitals:   10/18/20 1050  BP: 118/80  Pulse: 61  SpO2: 98%    General Examination: The patient is a very pleasant 70 y.o. female in no acute distress. She appears well-developed and well-nourished and well groomed.  No lightheadedness upon standing.  HEENT:Normocephalic, atraumatic, pupils are equal, round and reactive to light and accommodation.  She currently has no vertiginous spells with sudden changes in neck or head position. She has no difficulty with tracking, she has no facial masking, she has a mild voice tremor and a very slight and intermittent head tremor, seems improved.  She has no orofacial dyskinesias, tongue movements are normal, airway examination reveals mild to moderate mouth dryness but otherwise nonfocal findings. She has no carotid bruits.  Chest:Clear to auscultation without wheezing, rhonchi or crackles noted.  Heart:S1+S2+0, regular and normal without murmurs, rubs or gallops noted.   Abdomen:Soft, non-tender and non-distended with normal bowel sounds appreciated on auscultation.  Extremities:There isnopitting edema in the distal lower extremities bilaterally.   Skin: Warm and dry without trophic changes noted.  Musculoskeletal: exam reveals no obvious joint deformities, tenderness or joint swelling or erythema, with the exception of right knee pain reported.   Neurologically:  Mental status: The patient is awake, alert and oriented in all 4 spheres.Herimmediate and remote memory, attention, language skills and fund of knowledge are appropriate. There is no evidence of aphasia, agnosia,  apraxia or anomia. Speech is clear with normal prosody and enunciation. Thought process is linear. Mood is normaland affect is blunted.   Cranial nerves II - XII are as described above under HEENT exam.   Motor exam: Normal bulk, strength and tone is noted. There is noresting tremor.  She has a very slight postural tremor in both upper extremities.  This seems to be better compared to last  time.  Fine motor skills are globally mildly impaired, with finger taps and foot taps and hand movements.  (On12/09/2017: on Archimedes spiral drawing she has no significant difficulty with her right hand which is her dominant hand, with the left hand she has a coarse tremor, handwriting with her right hand is slightly tremulous, legible, not particularly micrographic.)  Romberg is negative. Reflexes are 1+ throughout. Fine motor skills and coordination:as above. Cerebellar testing: No dysmetriabut slightintention tremor on finger to nose testing. There is no truncal or gait ataxia.  Sensory exam: intact to light touch in the upper and lower extremities.  Gait, station and balance:Shestands easily. No veering to one side is noted. No leaning to one side is noted. Posture is age-appropriate and stance is narrow based. Gait showsslight limp on the right, preserved arm swing but walks slightly slowly.  Assessmentand plan:   In summary,Almer K Crossis a very pleasant 64 year oldfemalewith an underlying medical history coronary artery disease with status post MI, leg edema, arthritis, reflux disease, anxiety and obesity, whopresents for FU consultation of her hand tremors.  She also has had intermittent vertigo symptoms, had seen ENT, has had physical therapy.  Meclizine was tried to her PCP which was not helpful.  For her tremors, I had suggested a trial of Jillian [name] but there was an interaction between Jillian [name] and Jillian [name].  She continues to be on Jillian [name].  She is advised that I would not  recommend trial of Jillian [name] at this time.  She reports issues with her balance, Jillian [name] can potentially exacerbate balance issues.  In addition, her tremor seems to be slightly better compared to 2 years ago.  She was advised to continue to stay well-hydrated with water, change positions slowly, and talk to her primary care about depression and anxiety management as well as the possibility of seeing a psychiatrist.  She is wondering if she should see a psychiatrist.  Her sister also believes that she would benefit from seeing a counselor.  These issues are valid, I encouraged her to see her primary care physician for this.  At this juncture, she is advised to follow-up with her primary care on a regular basis.  She does not have any significant vertigo or orthostatic lightheadedness today.  I answered all their questions today and the patient and her sister were in agreement.  I spent 32 minutes in total face-to-face time and in reviewing records during pre-charting, more than 50% of which was spent in counseling and coordination of care, reviewing test results, reviewing medications and treatment regimen and/or in discussing or reviewing the diagnosis of tremor, the prognosis and treatment options. Pertinent laboratory and imaging test results that were available during this visit with the patient were reviewed by me and considered in my medical decision making (see chart for details).

## 2020-10-18 NOTE — Patient Instructions (Signed)
It was good to see you again today.  For your tremor I would not recommend any medication.  You can talk to your primary care physician about your medication management, if you want to consider something else rather than Seroquel.  I do believe you may benefit from seeing a psychiatrist and also counselor as we discussed.  Please follow-up with your primary care physician regarding the lump in your chest wall as well as you are concerned about oxygen desaturations.  We do not need to make a follow-up routinely in this clinic.  For your dizziness, I recommend that you continue to change positions slowly, hydrate well, and do your exercises as instructed by your physical therapist when you did vestibular rehab.  Your balance issues may also tie in with your arthritis in your right knee.  Please consider using a cane if she feels unstable as you are limping.

## 2020-10-26 ENCOUNTER — Encounter: Payer: Self-pay | Admitting: Neurology

## 2020-11-01 ENCOUNTER — Ambulatory Visit: Payer: Medicare Other | Admitting: Neurology

## 2020-12-11 ENCOUNTER — Other Ambulatory Visit: Payer: Self-pay

## 2020-12-11 ENCOUNTER — Ambulatory Visit (HOSPITAL_COMMUNITY)
Admission: EM | Admit: 2020-12-11 | Discharge: 2020-12-11 | Disposition: A | Discharge status: Home / Self Care | Payer: Medicare Other | Attending: Family Medicine | Admitting: Family Medicine

## 2020-12-11 ENCOUNTER — Encounter (HOSPITAL_COMMUNITY): Payer: Self-pay | Admitting: Emergency Medicine

## 2020-12-11 DIAGNOSIS — R42 Dizziness and giddiness: Secondary | ICD-10-CM | POA: Diagnosis not present

## 2020-12-11 DIAGNOSIS — R110 Nausea: Secondary | ICD-10-CM

## 2020-12-11 MED ORDER — ONDANSETRON 4 MG PO TBDP: 4.0000 mg | ORAL_TABLET | Freq: Three times a day (TID) | ORAL | 0 refills | Status: DC | PRN

## 2020-12-11 MED ORDER — LORAZEPAM 0.5 MG PO TABS: 0.5000 mg | ORAL_TABLET | Freq: Three times a day (TID) | ORAL | 0 refills | Status: DC | PRN

## 2020-12-11 NOTE — ED Triage Notes (Signed)
PT reports dizziness. She has a history of vertigo. She has had 2 meclizine today. She has an ENT appointment in July.

## 2020-12-11 NOTE — ED Provider Notes (Signed)
[facility]   [id number] 12/11/20 Arrival Time: 1539  ASSESSMENT & PLAN:  1. Vertigo   2. Nausea without vomiting     Normal neurologic exam. No indication for neurodiagnostic imaging at this time. Discussed. Has upcoming appt with ENT.  Trial of: Meds ordered this encounter  Medications   LORazepam (ATIVAN) 0.5 MG tablet    Sig: Take 1 tablet (0.5 mg total) by mouth every 8 (eight) hours as needed for anxiety.    Dispense:  30 tablet    Refill:  0   ondansetron (ZOFRAN-ODT) 4 MG disintegrating tablet    Sig: Take 1 tablet (4 mg total) by mouth every 8 (eight) hours as needed for nausea or vomiting.    Dispense:  20 tablet    Refill:  0   ED if acute worsening. Reviewed expectations re: course of current medical issues. Questions answered. Outlined signs and symptoms indicating need for more acute intervention. Patient verbalized understanding. After Visit Summary given.   SUBJECTIVE:  Jillian [name] is a 70 y.o. female who reports vertigo; pretty much daily over past few months. Has seen ENT once in past; 'did some therapy'. Some help. Vertigo triggered by head movements. With associated nausea; no emesis. No CP/SOB/HA/visual changes. No extremity sensation changes or weakness. Has tried meclizine without relief. Ambulatory here.  Social History   Substance and Sexual Activity  Alcohol Use Yes   Comment: rare   Social History   Tobacco Use  Smoking Status Every Day   Packs/day: 0.50   Pack years: 0.00   Types: Cigarettes  Smokeless Tobacco Never     OBJECTIVE:  Vitals:   12/11/20 1637  BP: 112/67  Pulse: 62  Resp: 16  Temp: 98 F (36.7 C)  TempSrc: Oral  SpO2: 95%    General appearance: alert; no distress Eyes: PERRLA; EOMI; conjunctiva normal HENT: normocephalic; atraumatic; TMs normal; nasal mucosa normal; oral mucosa normal Neck: supple with FROM Skin: warm and dry Neurologic: normal gait; CN 2-12 grossly intact; rapid changes in  position during the exam do precipitate brief vertigo Psychological: alert and cooperative; normal mood and affect    Allergies  Allergen Reactions   Hydrocodone Nausea And Vomiting   Opium Nausea And Vomiting    Patient states all narcotics make her N/V    Past Medical History:  Diagnosis Date   Adjustment disorder, unspecified    Arthritis    Coronary artery disease    Edema leg    GAD (generalized anxiety disorder)    GERD (gastroesophageal reflux disease)    Myocardial infarction (HCC) 1995   saw dr Tresa Endo    PONV (postoperative nausea and vomiting)    Tremor, unspecified    Unspecified mood (affective) disorder (HCC)    Vein disorder    lower legs   Social History   Socioeconomic History   Marital status: Divorced    Spouse name: Not on file   Number of children: Not on file   Years of education: Not on file   Highest education level: Not on file  Occupational History   Not on file  Tobacco Use   Smoking status: Every Day    Packs/day: 0.50    Pack years: 0.00    Types: Cigarettes   Smokeless tobacco: Never  Substance and Sexual Activity   Alcohol use: Yes    Comment: rare   Drug use: No   Sexual activity: Not on file  Other Topics Concern   Not on file  Social History Narrative   Not on file   Social Determinants of Health   Financial Resource Strain: Not on file  Food Insecurity: Not on file  Transportation Needs: Not on file  Physical Activity: Not on file  Stress: Not on file  Social Connections: Not on file  Intimate Partner Violence: Not on file   No family history on file. Past Surgical History:  Procedure Laterality Date   COLONOSCOPY W/ POLYPECTOMY  2013   KNEE ARTHROSCOPY Right 11/23/2012   Procedure: RIGHT KNEE ARTHROSCOPY PARTIAL MEDIAL MENISECTOMY CHRONDROPLASTY;  Surgeon: Velna Ochs, MD;  Location: [facility];  Service: Orthopedics;  Laterality: Right;   TUBAL LIGATION  1973   VEIN SURGERY          Mardella Layman, MD 12/11/20 1730

## 2020-12-11 NOTE — Discharge Instructions (Addendum)
Be aware, you have been prescribed medications that may cause drowsiness. Do not combine with alcohol or other illicit drugs. Please do not drive, operate heavy machinery, or take part in activities that require making important decisions while on this medication as your judgement may be clouded.  

## 2020-12-11 NOTE — ED Triage Notes (Signed)
This episode has been going on for 5 days. Denies vision changes, denies unilateral weakness

## 2020-12-27 ENCOUNTER — Other Ambulatory Visit: Payer: Self-pay | Admitting: Otolaryngology

## 2020-12-27 DIAGNOSIS — H918X9 Other specified hearing loss, unspecified ear: Secondary | ICD-10-CM

## 2021-01-06 ENCOUNTER — Ambulatory Visit
Admission: RE | Admit: 2021-01-06 | Discharge: 2021-01-06 | Disposition: A | Discharge status: Home / Self Care | Payer: Medicare Other | Source: Ambulatory Visit | Attending: Otolaryngology | Admitting: Otolaryngology

## 2021-01-06 DIAGNOSIS — H918X9 Other specified hearing loss, unspecified ear: Secondary | ICD-10-CM

## 2021-01-06 MED ORDER — GADOBENATE DIMEGLUMINE 529 MG/ML IV SOLN
14.0000 mL | Freq: Once | INTRAVENOUS | Status: AC | PRN
  Administered 2021-01-06: 14 mL via INTRAVENOUS

## 2021-01-16 ENCOUNTER — Ambulatory Visit: Payer: Medicare Other | Admitting: Cardiovascular Disease

## 2021-01-16 ENCOUNTER — Encounter: Payer: Self-pay | Admitting: Cardiovascular Disease

## 2021-01-16 ENCOUNTER — Other Ambulatory Visit: Payer: Self-pay

## 2021-01-16 DIAGNOSIS — R42 Dizziness and giddiness: Secondary | ICD-10-CM | POA: Diagnosis not present

## 2021-01-16 DIAGNOSIS — I8393 Asymptomatic varicose veins of bilateral lower extremities: Secondary | ICD-10-CM

## 2021-01-16 DIAGNOSIS — R251 Tremor, unspecified: Secondary | ICD-10-CM

## 2021-01-16 DIAGNOSIS — Z9861 Coronary angioplasty status: Secondary | ICD-10-CM | POA: Diagnosis not present

## 2021-01-16 DIAGNOSIS — I252 Old myocardial infarction: Secondary | ICD-10-CM | POA: Diagnosis not present

## 2021-01-16 NOTE — Progress Notes (Signed)
Cardiology Office Note    Date:  01/23/2021   ID:  Jillian [name], DOB 07-02-1950, MRN [id number]  PCP:  Jillian Rasmussen, MD  Cardiologist:  Jillian Majestic, MD   New cardiology evaluation history of dizziness and heart fluttering.   History of Present Illness:  Jillian [name] is a 70 y.o. female who has been followed by Dr. Horald [name] for primary care.  She states that 1 day after her sister committed suicide in 1994 she suffered a myocardial infarction and underwent PTCA.  She denies any recent history of chest tightness or anginal symptomatology.  She has a history of varicose veins.  She has had issues with dizziness balance issues, feeling faint, vertigo and occasional fluttering of her heart.  She saw Dr. Benjamine [name] and apparently she was told that her inner ear was fine and her symptoms were not due to ear etiology.  Recently, her dizziness has improved.  She has been on meclizine.  She also has a history of tremors for which she has been on propranolol extended release 60 mg daily.  She presented to the [facility] emergency room on December 11, 2020 with vertigo and nausea without vomiting.  She was felt to have a normal neurologic exam and there was no indication for nor diagnostic imaging.  This evaluation was prior to her ED evaluation and she states her ENT evaluation was stable.  She presents to the office today wondering if her heart is causing some of her symptoms.  Past Medical History:  Diagnosis Date   Adjustment disorder, unspecified    Arthritis    Coronary artery disease    Edema leg    GAD (generalized anxiety disorder)    GERD (gastroesophageal reflux disease)    Myocardial infarction [facility]) 1995   saw dr Jillian [name]    PONV (postoperative nausea and vomiting)    Tremor, unspecified    Unspecified mood (affective) disorder (HCC)    Vein disorder    lower legs    Past Surgical History:  Procedure Laterality Date   COLONOSCOPY W/ POLYPECTOMY  2013   KNEE ARTHROSCOPY Right  11/23/2012   Procedure: RIGHT KNEE ARTHROSCOPY PARTIAL MEDIAL MENISECTOMY CHRONDROPLASTY;  Surgeon: Hessie Dibble, MD;  Location: [facility];  Service: Orthopedics;  Laterality: Right;   TUBAL LIGATION  1973   VEIN SURGERY      Current Medications: Outpatient Medications Prior to Visit  Medication Sig Dispense Refill   ASPIRIN 81 PO Take by mouth.     LORazepam (ATIVAN) 0.5 MG tablet Take 1 tablet (0.5 mg total) by mouth every 8 (eight) hours as needed for anxiety. 30 tablet 0   meclizine (ANTIVERT) 25 MG tablet Take 25 mg by mouth 3 (three) times daily as needed for dizziness.     propranolol ER (INDERAL LA) 60 MG 24 hr capsule Take 60 mg by mouth daily.     QUEtiapine (SEROQUEL) 100 MG tablet Take 100 mg by mouth at bedtime.      Vitamin D, Ergocalciferol, (DRISDOL) 1.25 MG (50000 UNIT) CAPS capsule Take 50,000 Units by mouth once a week.     ondansetron (ZOFRAN-ODT) 4 MG disintegrating tablet Take 1 tablet (4 mg total) by mouth every 8 (eight) hours as needed for nausea or vomiting. 20 tablet 0   No facility-administered medications prior to visit.     Allergies:   Hydrocodone and Opium   Social History   Socioeconomic History   Marital status: Divorced  Spouse name: Not on file   Number of children: Not on file   Years of education: Not on file   Highest education level: Not on file  Occupational History   Not on file  Tobacco Use   Smoking status: Every Day    Packs/day: 0.50    Types: Cigarettes   Smokeless tobacco: Never  Substance and Sexual Activity   Alcohol use: Yes    Comment: rare   Drug use: No   Sexual activity: Not on file  Other Topics Concern   Not on file  Social History Narrative   Not on file   Social Determinants of Health   Financial Resource Strain: Not on file  Food Insecurity: Not on file  Transportation Needs: Not on file  Physical Activity: Not on file  Stress: Not on file  Social Connections: Not on file     Socially she is divorced since 2003.  She has no children.  She is retired and previously had worked at [employer].  She completed high school.  She has a labral doodle dog.  She does use tobacco but does not smoke every day smokes when she is stressed.  She does not drink alcohol.  She walks on a treadmill and walks her dog.  Family History: Family history is notable that her mother is living at age 66.  Her father died suddenly with a cardiac arrest at age 89.  She has a living brother age 90 and a brother died at 5 with a blood clot.  She has a living sister age 43 and a sister age 22 committed suicide.  ROS General: Negative; No fevers, chills, or night sweats;  HEENT: Negative; No changes in vision or hearing, sinus congestion, difficulty swallowing Pulmonary: Negative; No cough, wheezing, shortness of breath, hemoptysis Cardiovascular: Negative; No chest pain, presyncope, syncope, palpitations GI: Negative; No nausea, vomiting, diarrhea, or abdominal pain GU: Negative; No dysuria, hematuria, or difficulty voiding Musculoskeletal: Negative; no myalgias, joint pain, or weakness Hematologic/Oncology: Negative; no easy bruising, bleeding Endocrine: Negative; no heat/cold intolerance; no diabetes Neuro: For anxiety.  Vertigo Skin: Negative; No rashes or skin lesions Psychiatric: Negative; No behavioral problems, depression Sleep: Negative; No snoring, daytime sleepiness, hypersomnolence, bruxism, restless legs, hypnogognic hallucinations, no cataplexy Other comprehensive 14 point system review is negative.   PHYSICAL EXAM:   VS:  BP 118/82   Pulse 60   Ht _0  (1.651 m)   Wt 165 lb 3.2 oz (74.9 kg)   SpO2 98%   BMI 27.49 kg/m     Repeat blood pressure by me was 120/78 supine and 120/76 standing.  Wt Readings from Last 3 Encounters:  01/16/21 165 lb 3.2 oz (74.9 kg)  10/18/20 164 lb (74.4 kg)  09/02/18 182 lb 8 oz (82.8 kg)    General: Alert, oriented, no distress.   Skin: normal turgor, no rashes, warm and dry HEENT: Normocephalic, atraumatic. Pupils equal round and reactive to light; sclera anicteric; extraocular muscles intact;  Nose without nasal septal hypertrophy Mouth/Parynx benign; Mallinpatti scale 3 Neck: No JVD, no carotid bruits; normal carotid upstroke Lungs: clear to ausculatation and percussion; no wheezing or rales Chest wall: without tenderness to palpitation Heart: PMI not displaced, RRR, s1 s2 normal, 1/6 systolic murmur, no diastolic murmur, no rubs, gallops, thrills, or heaves Abdomen: soft, nontender; no hepatosplenomehaly, BS+; abdominal aorta nontender and not dilated by palpation. Back: no CVA tenderness Pulses 2+ Musculoskeletal: full range of motion, normal strength, no joint deformities Extremities:  Varicose veins; no clubbing cyanosis or edema, Homan's sign negative  Neurologic: grossly nonfocal; Cranial nerves grossly wnl Psychologic: Normal mood and affect   Studies/Labs Reviewed:   EKG:  EKG is ordered today.  ECG (independently read by me):  NSR at 60 , low voltage , nondiagnostic Q wave inferiorly  Recent Labs: BMP Latest Ref Rng & Units 01/18/2021 05/19/2018 10/30/2008  Glucose 65 - 99 mg/dL 91 82 129(H)  BUN 8 - 27 mg/dL _0 Creatinine 0.57 - 1.00 mg/dL 0.66 0.64 0.74  BUN/Creat Ratio 12 - _1 -  Sodium 134 - 144 mmol/L 139 138 140  Potassium 3.5 - 5.2 mmol/L 5.5(H) 4.6 3.4(L)  Chloride 96 - 106 mmol/L 100 98 103  CO2 20 - 29 mmol/L _2 Calcium 8.7 - 10.3 mg/dL 9.3 9.7 9.0     Hepatic Function Latest Ref Rng & Units 01/18/2021 05/19/2018  Total Protein 6.0 - 8.5 g/dL 6.4 6.7  Albumin 3.8 - 4.8 g/dL 4.0 4.5  AST 0 - 40 IU/L 19 18  ALT 0 - 32 IU/L 16 20  Alk Phosphatase 44 - 121 IU/L 99 106  Total Bilirubin 0.0 - 1.2 mg/dL 0.2 0.2    CBC Latest Ref Rng & Units 01/18/2021 11/23/2012 10/30/2008  WBC 3.4 - 10.8 x10E3/uL 8.0 - 10.6(H)  Hemoglobin 11.1 - 15.9 g/dL 15.6 15.5(H) 13.1  Hematocrit 34.0  - 46.6 % 46.9(H) - 38.8  Platelets 150 - 450 x10E3/uL 218 - 179   Lab Results  Component Value Date   MCV 84 01/18/2021   MCV 82.9 10/30/2008   Lab Results  Component Value Date   TSH 1.520 01/18/2021   No results found for: HGBA1C   BNP No results found for: BNP  ProBNP No results found for: PROBNP   Lipid Panel     Component Value Date/Time   CHOL 207 (H) 01/18/2021 0934   TRIG 145 01/18/2021 0934   HDL 51 01/18/2021 0934   CHOLHDL 4.1 01/18/2021 0934   LDLCALC 130 (H) 01/18/2021 0934   LABVLDL 26 01/18/2021 0934     RADIOLOGY: MR BRAIN W WO CONTRAST  Result Date: 01/07/2021 CLINICAL DATA:  Asymmetrical hearing loss. Vertigo for several months. EXAM: MRI HEAD WITHOUT AND WITH CONTRAST TECHNIQUE: Multiplanar, multiecho pulse sequences of the brain and surrounding structures were obtained without and with intravenous contrast. CONTRAST:  44m MULTIHANCE GADOBENATE DIMEGLUMINE 529 MG/ML IV SOLN COMPARISON:  Head MRI 06/01/2018. Head and maxillofacial CTs 06/30/2017. FINDINGS: Brain: There is no evidence of an acute infarct, intracranial hemorrhage, mass, midline shift, or extra-axial fluid collection. Small T2 hyperintensities in the cerebral white matter bilaterally are unchanged and nonspecific but compatible with mild-to-moderate chronic small vessel ischemic disease. There is mild-to-moderate cerebral and cerebellar atrophy. No abnormal enhancement is identified. 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 There is no extension into the sphenoid sinus. Dedicated imaging through the internal auditory canals demonstrates a normal course of cranial nerves VII and VIII without evidence of a mass or abnormal enhancement. There is mild chronic asymmetric narrowing of the right porus acusticus due to hyperostosis. The inner ear structures demonstrate normal signal and  morphology bilaterally. No mass is present in the cerebellopontine angles. Vascular: Major intracranial vascular flow voids are preserved. Skull and upper cervical spine: Unremarkable bone marrow signal. Sinuses/Orbits: Bilateral cataract extraction. Mild mucosal thickening in the left sphenoid  sinus, less than on the prior MRI. No significant mastoid fluid. Other: Unchanged 13 mm T2 hyperintense focus in the right parotid gland. IMPRESSION: 1. Unremarkable internal auditory canal imaging aside from mild chronic bony narrowing of the right porus acusticus. 2. No acute intracranial abnormality. 3. Mild-to-moderate chronic small vessel ischemic disease and cerebral atrophy. 4. 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 Electronically Signed   By: Logan Bores M.D.   On: 01/07/2021 10:39     Additional studies/ records that were reviewed today include:  I reviewed the patient's ER evaluation well as neurologic evaluation.    ASSESSMENT:    1. History of MI (myocardial infarction)   2. Varicose veins of both lower extremities, unspecified whether complicated   3. Dizziness   4. Intermittent vertigo   5. Tremor of both hands    PLAN:  Ms. Katilin Postell is a 70 year old female who apparently suffered a myocardial infarction in 1994 1 day following the suicide death of her sister.  She states she underwent cardiac catheterization and PTCA.  I do not have records of this.  She has a history of tremor and has been on propranolol ER 60 mg daily.  She also has had issues with vertigo and has been on Antivert 25 mg 3 times per day as needed.  Addition she takes lorazepam for anxiety and Seroquel prior to bed.  She has not had recent laboratory in the last laboratory I saw was from 2019.  I am recommending she undergo complete set of fasting laboratory.  I am also scheduling her for an echo Doppler study.  Her ECG today is unremarkable..  She is not having any anginal symptoms.  She is not orthostatic  on exam.  She underwent a comprehensive ENT evaluation which reportedly was negative.  She had made this appointment with me prior to knowing the results of her ENT exam.  Presently, she seems cardiovascularly stable.  She is not having any palpitations that she is aware of and at this point I do not believe cardiac monitoring is indicated.  I will contact her regarding the results of her laboratory and echo Doppler study.  I will see her in 1 year for reevaluation or sooner depending upon her laboratory and echocardiographic assessment.   Medication Adjustments/Labs and Tests Ordered: Current medicines are reviewed at length with the patient today.  Concerns regarding medicines are outlined above.  Medication changes, Labs and Tests ordered today are listed in the Patient Instructions below. Patient Instructions  Medication Instructions:  Your physician recommends that you continue on your current medications as directed. Please refer to the Current Medication list given to you today.  *If you need a refill on your cardiac medications before your next appointment, please call your pharmacy*   Lab Work: FASTING - Lipid, CBC, CMET, TSH, Magnesium, Vitamin D  If you have labs (blood work) drawn today and your tests are completely normal, you will receive your results only by: [place] (if you have MyChart) OR A paper copy in the mail If you have any lab test that is abnormal or we need to change your treatment, we will call you to review the results.   Testing/Procedures: Your physician has requested that you have an echocardiogram. Echocardiography is a painless test that uses sound waves to create images of your heart. It provides your doctor with information about the size and shape of your heart and how well your heart's chambers and valves are working. This procedure  takes approximately one hour. There are no restrictions for this procedure.    Follow-Up: At [facility], you  and your health needs are our priority.  As part of our continuing mission to provide you with exceptional heart care, we have created designated Provider Care Teams.  These Care Teams include your primary Cardiologist (physician) and Advanced Practice Providers (APPs -  Physician Assistants and Nurse Practitioners) who all work together to provide you with the care you need, when you need it.  We recommend signing up for the patient portal called "MyChart".  Sign up information is provided on this After Visit Summary.  MyChart is used to connect with patients for Virtual Visits (Telemedicine).  Patients are able to view lab/test results, encounter notes, upcoming appointments, etc.  Non-urgent messages can be sent to your provider as well.   To learn more about what you can do with MyChart, go to [email or web address].    Your next appointment:   12 month(s)  The format for your next appointment:   In Person  Provider:   Shelva Majestic, MD      Signed, Jillian Majestic, MD  01/23/2021 6:47 PM    [facility] [address], [place], [place], [place]  [address] Phone: [phone]

## 2021-01-16 NOTE — Patient Instructions (Signed)
Medication Instructions:  Your physician recommends that you continue on your current medications as directed. Please refer to the Current Medication list given to you today.  *If you need a refill on your cardiac medications before your next appointment, please call your pharmacy*   Lab Work: FASTING - Lipid, CBC, CMET, TSH, Magnesium, Vitamin D  If you have labs (blood work) drawn today and your tests are completely normal, you will receive your results only by: MyChart Message (if you have MyChart) OR A paper copy in the mail If you have any lab test that is abnormal or we need to change your treatment, we will call you to review the results.   Testing/Procedures: Your physician has requested that you have an echocardiogram. Echocardiography is a painless test that uses sound waves to create images of your heart. It provides your doctor with information about the size and shape of your heart and how well your heart's chambers and valves are working. This procedure takes approximately one hour. There are no restrictions for this procedure.    Follow-Up: At [facility], you and your health needs are our priority.  As part of our continuing mission to provide you with exceptional heart care, we have created designated Provider Care Teams.  These Care Teams include your primary Cardiologist (physician) and Advanced Practice Providers (APPs -  Physician Assistants and Nurse Practitioners) who all work together to provide you with the care you need, when you need it.  We recommend signing up for the patient portal called "MyChart".  Sign up information is provided on this After Visit Summary.  MyChart is used to connect with patients for Virtual Visits (Telemedicine).  Patients are able to view lab/test results, encounter notes, upcoming appointments, etc.  Non-urgent messages can be sent to your provider as well.   To learn more about what you can do with MyChart, go to  [email or web address].    Your next appointment:   12 month(s)  The format for your next appointment:   In Person  Provider:   Nicki Guadalajara, MD

## 2021-01-19 LAB — COMPREHENSIVE METABOLIC PANEL
ALT: 16 IU/L (ref 0–32)
AST: 19 IU/L (ref 0–40)
Albumin/Globulin Ratio: 1.7 (ref 1.2–2.2)
Albumin: 4 g/dL (ref 3.8–4.8)
Alkaline Phosphatase: 99 IU/L (ref 44–121)
BUN/Creatinine Ratio: 15 (ref 12–28)
BUN: 10 mg/dL (ref 8–27)
Bilirubin Total: 0.2 mg/dL (ref 0.0–1.2)
CO2: 26 mmol/L (ref 20–29)
Calcium: 9.3 mg/dL (ref 8.7–10.3)
Chloride: 100 mmol/L (ref 96–106)
Creatinine, Ser: 0.66 mg/dL (ref 0.57–1.00)
Globulin, Total: 2.4 g/dL (ref 1.5–4.5)
Glucose: 91 mg/dL (ref 65–99)
Potassium: 5.5 mmol/L — ABNORMAL HIGH (ref 3.5–5.2)
Sodium: 139 mmol/L (ref 134–144)
Total Protein: 6.4 g/dL (ref 6.0–8.5)
eGFR: 95 mL/min/{1.73_m2} (ref >59)

## 2021-01-19 LAB — LIPID PANEL
Chol/HDL Ratio: 4.1 ratio (ref 0.0–4.4)
Cholesterol, Total: 207 mg/dL — ABNORMAL HIGH (ref 100–199)
HDL: 51 mg/dL (ref >39)
LDL Chol Calc (NIH): 130 mg/dL — ABNORMAL HIGH (ref 0–99)
Triglycerides: 145 mg/dL (ref 0–149)
VLDL Cholesterol Cal: 26 mg/dL (ref 5–40)

## 2021-01-19 LAB — CBC
Hematocrit: 46.9 % — ABNORMAL HIGH (ref 34.0–46.6)
Hemoglobin: 15.6 g/dL (ref 11.1–15.9)
MCH: 27.9 pg (ref 26.6–33.0)
MCHC: 33.3 g/dL (ref 31.5–35.7)
MCV: 84 fL (ref 79–97)
Platelets: 218 10*3/uL (ref 150–450)
RBC: 5.6 x10E6/uL — ABNORMAL HIGH (ref 3.77–5.28)
RDW: 13.6 % (ref 11.7–15.4)
WBC: 8 10*3/uL (ref 3.4–10.8)

## 2021-01-19 LAB — TSH: TSH: 1.52 u[IU]/mL (ref 0.450–4.500)

## 2021-01-19 LAB — VITAMIN D 25 HYDROXY (VIT D DEFICIENCY, FRACTURES): Vit D, 25-Hydroxy: 41.4 ng/mL (ref 30.0–100.0)

## 2021-01-23 ENCOUNTER — Encounter: Payer: Self-pay | Admitting: Cardiovascular Disease

## 2021-01-29 ENCOUNTER — Other Ambulatory Visit: Payer: Self-pay

## 2021-01-29 ENCOUNTER — Ambulatory Visit (HOSPITAL_COMMUNITY): Payer: Medicare Other | Attending: Cardiovascular Disease

## 2021-01-29 DIAGNOSIS — I252 Old myocardial infarction: Secondary | ICD-10-CM | POA: Insufficient documentation

## 2021-01-29 LAB — ECHOCARDIOGRAM COMPLETE
Area-P 1/2: 3.54 cm2
S' Lateral: 3.2 cm

## 2021-02-04 ENCOUNTER — Other Ambulatory Visit: Payer: Self-pay

## 2021-02-04 DIAGNOSIS — I252 Old myocardial infarction: Secondary | ICD-10-CM

## 2021-02-04 DIAGNOSIS — Z79899 Other long term (current) drug therapy: Secondary | ICD-10-CM

## 2021-02-11 ENCOUNTER — Telehealth: Payer: Medicare Other | Admitting: Family

## 2021-02-15 ENCOUNTER — Other Ambulatory Visit: Payer: Self-pay

## 2021-02-15 DIAGNOSIS — Z79899 Other long term (current) drug therapy: Secondary | ICD-10-CM

## 2021-02-15 DIAGNOSIS — I252 Old myocardial infarction: Secondary | ICD-10-CM

## 2021-02-15 LAB — BASIC METABOLIC PANEL
BUN/Creatinine Ratio: 13 (ref 12–28)
BUN: 8 mg/dL (ref 8–27)
CO2: 27 mmol/L (ref 20–29)
Calcium: 9.7 mg/dL (ref 8.7–10.3)
Chloride: 98 mmol/L (ref 96–106)
Creatinine, Ser: 0.64 mg/dL (ref 0.57–1.00)
Glucose: 90 mg/dL (ref 65–99)
Potassium: 5.1 mmol/L (ref 3.5–5.2)
Sodium: 138 mmol/L (ref 134–144)
eGFR: 96 mL/min/{1.73_m2} (ref >59)

## 2021-05-27 ENCOUNTER — Telehealth: Payer: Self-pay

## 2021-05-27 NOTE — Telephone Encounter (Signed)
Dr. Tresa Endo to review. Jillian [name] was last seen in Aug 2022, recent echocardiogram was normal. Patient reportedly had a h/o MI in 1994 after her sister committed suicide and underwent PTCA. She denies any recent exertional chest pain or worsening dyspnea. She can walk very slowly in a grocery store for about 1 hour without issue, however because of her knee issue, she cannot walk very fast. I am not confident she can accomplish more than 4 METS of activity. Would you recommend additional evaluation prior to her knee surgery in Apr 2023?  She mentioned she is on the cancellation list for a potential earlier date for surgery, however she also admits she would prefer to have the surgery next Apr instead of earlier.   Please forward your response to P [facility] PREOP  Thank you

## 2021-05-27 NOTE — Telephone Encounter (Signed)
   Pre-operative Risk Assessment    Patient Name: Jillian [name]  DOB: 02/26/1951 MRN: [id number]      Request for Surgical Clearance    Procedure:   RIGHT TOTAL KNEE ARTHROPLASTY  Date of Surgery:  Clearance 09/23/21                                 Surgeon:  DR Ollen Gross ATTN: Aida Raider Surgeon's Group or Practice Name:  [facility] Phone number:  [phone] Fax number:  [phone]   Type of Clearance Requested:   - Medical    Type of Anesthesia:   CHOICE   Additional requests/questions:

## 2021-05-27 NOTE — Telephone Encounter (Signed)
Left message for the patient to call back and speak to the on-call preop APP of the day 

## 2021-05-29 NOTE — Telephone Encounter (Signed)
Since patient has been entirely asymptomatic since her intervention in 1994 and has not had recurrent symptomatology or repeat procedures probably okay to proceed with elective knee surgery

## 2021-05-30 NOTE — Telephone Encounter (Signed)
° °  Name: Jillian [name]  DOB: 1951/03/05  MRN: [id number]   Primary Cardiologist: Dr. Tresa Endo  Chart reviewed as part of pre-operative protocol coverage. Patient was contacted 05/30/2021 in reference to pre-operative risk assessment for pending surgery as outlined below.  Jillian [name] was last seen 01/2021 by Dr. Tresa Endo. Per Dr. Tresa Endo, "Since patient has been entirely asymptomatic since her intervention in 1994 and has not had recurrent symptomatology or repeat procedures probably okay to proceed with elective knee surgery." I relayed this to the patient. The patient was advised that if she develops new symptoms prior to surgery to contact our office to arrange for a follow-up visit, and she verbalized understanding.  I will route this recommendation to the requesting party via Epic fax function and remove from pre-op pool. Please call with questions.  Laurann Montana, PA-C 05/30/2021, 8:29 AM

## 2021-06-13 ENCOUNTER — Ambulatory Visit (HOSPITAL_COMMUNITY)
Admission: RE | Admit: 2021-06-13 | Discharge: 2021-06-13 | Disposition: A | Discharge status: Home / Self Care | Payer: Medicare Other | Source: Ambulatory Visit | Attending: Family Medicine | Admitting: Family Medicine

## 2021-06-13 ENCOUNTER — Encounter (HOSPITAL_COMMUNITY): Payer: Self-pay

## 2021-06-13 ENCOUNTER — Ambulatory Visit (INDEPENDENT_AMBULATORY_CARE_PROVIDER_SITE_OTHER): Payer: Medicare Other

## 2021-06-13 ENCOUNTER — Other Ambulatory Visit: Payer: Self-pay

## 2021-06-13 VITALS — BP 102/81 | HR 71 | Temp 98.2°F | Resp 18

## 2021-06-13 DIAGNOSIS — R079 Chest pain, unspecified: Secondary | ICD-10-CM

## 2021-06-13 DIAGNOSIS — R0789 Other chest pain: Secondary | ICD-10-CM

## 2021-06-13 MED ORDER — IBUPROFEN 800 MG PO TABS: 800.0000 mg | ORAL_TABLET | Freq: Three times a day (TID) | ORAL | 0 refills | Status: DC | PRN

## 2021-06-13 MED ORDER — TRAMADOL HCL 50 MG PO TABS
50.0000 mg | ORAL_TABLET | Freq: Four times a day (QID) | ORAL | 0 refills | Status: DC | PRN
Start: 2021-06-13 — End: 2021-06-13

## 2021-06-13 MED ORDER — TRAMADOL HCL 50 MG PO TABS
50.0000 mg | ORAL_TABLET | Freq: Four times a day (QID) | ORAL | 0 refills | Status: DC | PRN
Start: 2021-06-13 — End: 2021-07-22

## 2021-06-13 MED ORDER — TRAMADOL HCL 50 MG PO TABS: 50.0000 mg | ORAL_TABLET | Freq: Four times a day (QID) | ORAL | 0 refills | Status: DC | PRN

## 2021-06-13 NOTE — Discharge Instructions (Addendum)
Tramadol 50 mg 1 every 6 hours as needed for pain, and take with food  Ibuprofen 800 mg 1 every 8 hours as needed for pain

## 2021-06-13 NOTE — ED Triage Notes (Signed)
Pt is present today with bilateral rib pain. Pt states that she was cleaning her tub and slipped and hit her rib on the side of the tub on 12/25

## 2021-06-13 NOTE — ED Provider Notes (Signed)
[facility]    CSN: [id number] Arrival date & time: 06/13/21  1331      History   Chief Complaint Chief Complaint  Patient presents with   Flank Pain    HPI Jillian [name] is a 70 y.o. female.    Flank Pain  Here for pain in her left lower anterior rib cage.  She was cleaning a tub on 12/23, and she slipped and fell onto her lower chest. The right lower costal margin was painful some, but has improved. Her left lower costal margin is hurting, and she feels knot there.  Not on anticoagulants.  Past Medical History:  Diagnosis Date   Adjustment disorder, unspecified    Arthritis    Coronary artery disease    Edema leg    GAD (generalized anxiety disorder)    GERD (gastroesophageal reflux disease)    Myocardial infarction [facility]) 1995   saw dr Tresa Endo    PONV (postoperative nausea and vomiting)    Tremor, unspecified    Unspecified mood (affective) disorder (HCC)    Vein disorder    lower legs    Patient Active Problem List   Diagnosis Date Noted   Closed fracture of left patella 06/30/2017   Pre-diabetes 12/20/2015   Generalized anxiety disorder 09/20/2015   Major depressive disorder, recurrent episode, moderate (HCC) 09/20/2015   Venous insufficiency (chronic) (peripheral) 09/12/2015   Anxiety 09/04/2015   Insomnia 09/04/2015   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 08/28/2015   Gastroesophageal reflux disease without esophagitis 05/22/2015   Osteopenia 05/22/2015   Panic attacks 05/22/2015   Recurrent major depressive disorder (HCC) 05/22/2015   Tremor 05/22/2015   History of DVT of lower extremity 07/23/2012   LABYRINTHITIS 11/17/2007   HEADACHE 11/17/2007    Past Surgical History:  Procedure Laterality Date   COLONOSCOPY W/ POLYPECTOMY  2013   KNEE ARTHROSCOPY Right 11/23/2012   Procedure: RIGHT KNEE ARTHROSCOPY PARTIAL MEDIAL MENISECTOMY CHRONDROPLASTY;  Surgeon: Velna Ochs, MD;  Location: [facility];  Service: Orthopedics;  Laterality: Right;   TUBAL LIGATION  1973   VEIN SURGERY      OB History   No obstetric history on file.      Home Medications    Prior to Admission medications   Medication Sig Start Date End Date Taking? Authorizing Provider  ibuprofen (ADVIL) 800 MG tablet Take 1 tablet (800 mg total) by mouth every 8 (eight) hours as needed (pain). 06/13/21  Yes Zenia Resides, MD  traMADol (ULTRAM) 50 MG tablet Take 1 tablet (50 mg total) by mouth every 6 (six) hours as needed. 06/13/21  Yes Zenia Resides, MD  ASPIRIN 81 PO Take by mouth.    [provider]  LORazepam (ATIVAN) 0.5 MG tablet Take 1 tablet (0.5 mg total) by mouth every 8 (eight) hours as needed for anxiety. 12/11/20   Mardella Layman, MD  meclizine (ANTIVERT) 25 MG tablet Take 25 mg by mouth 3 (three) times daily as needed for dizziness.    [provider]  propranolol ER (INDERAL LA) 60 MG 24 hr capsule Take 60 mg by mouth daily. 02/13/20   [provider]  QUEtiapine (SEROQUEL) 100 MG tablet Take 100 mg by mouth at bedtime.     [provider]  Vitamin D, Ergocalciferol, (DRISDOL) 1.25 MG (50000 UNIT) CAPS capsule Take 50,000 Units by mouth once a week. 11/09/20   [provider]    Family History History reviewed. No  pertinent family history.  Social History Social History   Tobacco Use   Smoking status: Every Day    Packs/day: 0.50    Types: Cigarettes   Smokeless tobacco: Never  Substance Use Topics   Alcohol use: Yes    Comment: rare   Drug use: No     Allergies   Hydrocodone and Opium   Review of Systems Review of Systems  Genitourinary:  Positive for flank pain.    Physical Exam Triage Vital Signs ED Triage Vitals  Enc Vitals Group     BP 06/13/21 1422 102/81     Pulse Rate 06/13/21 1422 71     Resp 06/13/21 1422 18     Temp 06/13/21 1422 98.2 F (36.8 C)     Temp Source 06/13/21 1422 Oral     SpO2 06/13/21 1422 95  %     Weight --      Height --      Head Circumference --      Peak Flow --      Pain Score 06/13/21 1421 10     Pain Loc --      Pain Edu? --      Excl. in GC? --    No data found.  Updated Vital Signs BP 102/81    Pulse 71    Temp 98.2 F (36.8 C) (Oral)    Resp 18    SpO2 95%   Visual Acuity Right Eye Distance:   Left Eye Distance:   Bilateral Distance:    Right Eye Near:   Left Eye Near:    Bilateral Near:     Physical Exam Vitals reviewed.  Constitutional:      General: She is not in acute distress.    Appearance: She is not toxic-appearing.  HENT:     Mouth/Throat:     Mouth: Mucous membranes are moist.  Cardiovascular:     Rate and Rhythm: Normal rate and regular rhythm.     Heart sounds: No murmur heard. Pulmonary:     Effort: Pulmonary effort is normal.     Breath sounds: Normal breath sounds. No wheezing, rhonchi or rales.  Chest:     Chest wall: Tenderness (in mid clavicular line along the left lower costal margin. It seems symmetrical with the right lower costal margin) present.  Skin:    Capillary Refill: Capillary refill takes less than 2 seconds.     Coloration: Skin is not jaundiced or pale.  Neurological:     General: No focal deficit present.     Mental Status: She is alert and oriented to person, place, and time.     UC Treatments / Results  Labs (all labs ordered are listed, but only abnormal results are displayed) Labs Reviewed - No data to display  EKG   Radiology DG Chest 2 View  Result Date: 06/13/2021 CLINICAL DATA:  69 year old female with a history of left sided rib pain EXAM: CHEST - 2 VIEW COMPARISON:  10/30/2008 FINDINGS: Cardiomediastinal silhouette unchanged in size and contour. No evidence of central vascular congestion. No interlobular septal thickening. No pneumothorax or pleural effusion. Coarsened interstitial markings, with no confluent airspace disease. No acute displaced fracture. Degenerative changes of the spine.  IMPRESSION: Negative for acute cardiopulmonary disease Electronically Signed   By: Gilmer Mor D.O.   On: 06/13/2021 15:01    Procedures Procedures (including critical care time)  Medications Ordered in UC Medications - No data to display  Initial Impression / Assessment  and Plan / UC Course  I have reviewed the triage vital signs and the nursing notes.  Pertinent labs & imaging results that were available during my care of the patient were reviewed by me and considered in my medical decision making (see chart for details).     CXR benign. Final Clinical Impressions(s) / UC Diagnoses   Final diagnoses:  Chest wall pain     Discharge Instructions      Tramadol 50 mg 1 every 6 hours as needed for pain, and take with food  Ibuprofen 800 mg 1 every 8 hours as needed for pain     ED Prescriptions     Medication Sig Dispense Auth. Provider   ibuprofen (ADVIL) 800 MG tablet Take 1 tablet (800 mg total) by mouth every 8 (eight) hours as needed (pain). 21 tablet Leone Putman, Janace Aris, MD   traMADol (ULTRAM) 50 MG tablet Take 1 tablet (50 mg total) by mouth every 6 (six) hours as needed. 10 tablet Marlinda Mike Janace Aris, MD      I have reviewed the PDMP during this encounter.   Zenia Resides, MD 06/13/21 [id number]

## 2021-07-21 NOTE — Progress Notes (Signed)
New Patient Note  RE: Jillian [name] MRN: [id number] DOB: 06-18-1950 Date of Office Visit: 07/22/2021  Consult requested by: Ollen Gross, MD Primary care provider: Dois Davenport, MD  Chief Complaint: Establish Care and metal testing (nickel)  History of Present Illness: I had the pleasure of seeing Jillian [name] for initial evaluation at the [facility] on 07/22/2021. She is a 71 y.o. female, who is referred here by Dr. Lequita Halt (ortho) for the evaluation of nickel allergy.  Patient is scheduled for right replacement on 09/23/2021. Patient had issues with nickel and last reaction was about 15 years ago.   Patient had issues with nickel earrings mainly in the form of localized contact rashes/inflammation.  She had patch testing done about 18 years ago which was positive to nickel per patient report.   No issues with gold/silver or hypoallergenic jewelry.   Prior joint replacements or metals in the body: no.  Assessment and Plan: Jillian [name] is a 71 y.o. female with: Allergic contact dermatitis Rash/inflammation with nickel containing earrings. Last flare was 15 years ago. Patch testing 18 years ago as positive to nickel per patient report. No issues with other metals. No prior joint replacements.  Discussed with patient that patch testing tests for contact dermatitis and sometimes it does not correlate to how one will react to metals in the body. Positive patch testing results can help in avoiding those items however it is possible to get false negative results.  Nevertheless, this is the most accessible test for metal sensitivity currently available.  Metal patches placed today. Please avoid strenuous physical activities and do not get the patches on the back wet. No showering until final patch reading done. Okay to take antihistamines for itching but avoid placing any creams on the back where the patches are. We will remove the patches on Wednesday and will do  our initial read. Then you will come back on Friday for a final read.  Return in about 2 days (around 07/24/2021) for Patch reading.  No orders of the defined types were placed in this encounter.  Lab Orders  No laboratory test(s) ordered today    Other allergy screening: Asthma: no Rhino conjunctivitis: no Food allergy: no Medication allergy: yes Hymenoptera allergy: no Large localized reactions Urticaria: no Eczema: Sometimes breaks out on her arms.  History of recurrent infections suggestive of immunodeficency: no  Diagnostics: Metal patch testing:   Metals Patch - 07/22/21 1429     Time Antigen Placed 1429    Manufacturer Greer    Location Back    Number of Test 12    Reading Interval Day 1;Day 3;Day 5    Select Select    Other --   paper clip            Past Medical History: Patient Active Problem List   Diagnosis Date Noted   Allergic contact dermatitis 07/22/2021   Closed fracture of left patella 06/30/2017   Pre-diabetes 12/20/2015   Generalized anxiety disorder 09/20/2015   Major depressive disorder, recurrent episode, moderate (HCC) 09/20/2015   Venous insufficiency (chronic) (peripheral) 09/12/2015   Anxiety 09/04/2015   Insomnia 09/04/2015   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 08/28/2015   Gastroesophageal reflux disease without esophagitis 05/22/2015   Osteopenia 05/22/2015   Panic attacks 05/22/2015   Recurrent major depressive disorder (HCC) 05/22/2015   Tremor 05/22/2015   History of DVT of lower extremity 07/23/2012   LABYRINTHITIS 11/17/2007   HEADACHE 11/17/2007  Past Medical History:  Diagnosis Date   Adjustment disorder, unspecified    Arthritis    Coronary artery disease    Edema leg    GAD (generalized anxiety disorder)    GERD (gastroesophageal reflux disease)    Myocardial infarction [facility]) 1995   saw dr Tresa Endo    PONV (postoperative nausea and vomiting)    Tremor, unspecified    Unspecified mood  (affective) disorder (HCC)    Vein disorder    lower legs   Past Surgical History: Past Surgical History:  Procedure Laterality Date   COLONOSCOPY W/ POLYPECTOMY  2013   KNEE ARTHROSCOPY Right 11/23/2012   Procedure: RIGHT KNEE ARTHROSCOPY PARTIAL MEDIAL MENISECTOMY CHRONDROPLASTY;  Surgeon: Velna Ochs, MD;  Location: [facility];  Service: Orthopedics;  Laterality: Right;   TUBAL LIGATION  1973   VEIN SURGERY     Medication List:  Current Outpatient Medications  Medication Sig Dispense Refill   acetaminophen (TYLENOL) 500 MG tablet Take 500 mg by mouth every 6 (six) hours as needed.     clonazePAM (KLONOPIN) 0.5 MG tablet Take 0.5 mg by mouth daily as needed.     docusate sodium (STOOL SOFTENER) 100 MG capsule Take 100 mg by mouth daily as needed for mild constipation.     famotidine (PEPCID) 20 MG tablet Take 20 mg by mouth 2 (two) times daily.     meclizine (ANTIVERT) 25 MG tablet Take 25 mg by mouth 3 (three) times daily as needed for dizziness.     Polyethyl Glycol-Propyl Glycol (SYSTANE OP) Apply to eye.     propranolol ER (INDERAL LA) 60 MG 24 hr capsule Take 60 mg by mouth daily.     QUEtiapine (SEROQUEL) 100 MG tablet Take 100 mg by mouth at bedtime.      Vitamin D, Ergocalciferol, (DRISDOL) 1.25 MG (50000 UNIT) CAPS capsule Take 50,000 Units by mouth once a week.     ibuprofen (ADVIL) 800 MG tablet Take 1 tablet (800 mg total) by mouth every 8 (eight) hours as needed (pain). (Patient not taking: Reported on 07/22/2021) 21 tablet 0   No current facility-administered medications for this visit.   Allergies: Allergies  Allergen Reactions   Hydrocodone Nausea And Vomiting   Opium Nausea And Vomiting    Patient states all narcotics make her N/V   Social History: Social History   Socioeconomic History   Marital status: Divorced    Spouse name: Not on file   Number of children: Not on file   Years of education: Not on file   Highest education level: Not  on file  Occupational History   Not on file  Tobacco Use   Smoking status: Every Day    Packs/day: 0.50    Types: Cigarettes   Smokeless tobacco: Never  Substance and Sexual Activity   Alcohol use: Yes    Comment: rare   Drug use: No   Sexual activity: Not on file  Other Topics Concern   Not on file  Social History Narrative   Not on file   Social Determinants of Health   Financial Resource Strain: Not on file  Food Insecurity: Not on file  Transportation Needs: Not on file  Physical Activity: Not on file  Stress: Not on file  Social Connections: Not on file   Lives in a mobile home. Smoking: 1/2 pack per day some days.  Occupation: retired  Landscape architect HistorySurveyor, minerals in the house: no Engineer, civil (consulting) in the family room: no  Carpet in the bedroom: no Heating: heat pump Cooling: heat pump Pet: yes 1 dog  Family History: History reviewed. No pertinent family history. Problem                               Relation Asthma                                   Mother  Eczema                                no Food allergy                          no Allergic rhino conjunctivitis     no  Review of Systems  Constitutional:  Negative for appetite change, chills, fever and unexpected weight change.  HENT:  Negative for congestion and rhinorrhea.   Eyes:  Negative for itching.  Respiratory:  Negative for cough, chest tightness, shortness of breath and wheezing.   Cardiovascular:  Negative for chest pain.  Gastrointestinal:  Negative for abdominal pain.  Genitourinary:  Negative for difficulty urinating.  Skin:  Negative for rash.  Neurological:  Negative for headaches.   Objective: BP 122/82    Pulse 85    Temp 98.7 F (37.1 C)    Resp 18    Ht 5\' 5"  (1.651 m)    Wt 165 lb 4 oz (75 kg)    SpO2 95%    BMI 27.50 kg/m  Body mass index is 27.5 kg/m. Physical Exam Vitals and nursing note reviewed.  Constitutional:      Appearance: Normal appearance. She is  well-developed.  HENT:     Head: Normocephalic and atraumatic.     Right Ear: Tympanic membrane and external ear normal.     Left Ear: Tympanic membrane and external ear normal.     Nose: Nose normal.     Mouth/Throat:     Mouth: Mucous membranes are moist.     Pharynx: Oropharynx is clear.  Eyes:     Conjunctiva/sclera: Conjunctivae normal.  Cardiovascular:     Rate and Rhythm: Normal rate and regular rhythm.     Heart sounds: Normal heart sounds. No murmur heard.   No friction rub. No gallop.  Pulmonary:     Effort: Pulmonary effort is normal.     Breath sounds: Normal breath sounds. No wheezing, rhonchi or rales.  Musculoskeletal:     Cervical back: Neck supple.  Skin:    General: Skin is warm.     Findings: No rash.  Neurological:     Mental Status: She is alert and oriented to person, place, and time.  Psychiatric:        Behavior: Behavior normal.   The plan was reviewed with the patient/family, and all questions/concerned were addressed.  It was my pleasure to see Jillian [name] today and participate in her care. Please feel free to contact me with any questions or concerns.  Sincerely,  Wyline Mood, DO Allergy & Immunology  [facility] office: [phone] [facility] office: [phone]

## 2021-07-22 ENCOUNTER — Ambulatory Visit: Payer: Medicare Other | Admitting: Allergy

## 2021-07-22 ENCOUNTER — Other Ambulatory Visit: Payer: Self-pay

## 2021-07-22 ENCOUNTER — Encounter: Payer: Self-pay | Admitting: Allergy

## 2021-07-22 VITALS — BP 122/82 | HR 85 | Temp 98.7°F | Resp 18 | Ht 65.0 in | Wt 165.2 lb

## 2021-07-22 DIAGNOSIS — L239 Allergic contact dermatitis, unspecified cause: Secondary | ICD-10-CM | POA: Insufficient documentation

## 2021-07-22 NOTE — Assessment & Plan Note (Signed)
Rash/inflammation with nickel containing earrings. Last flare was 15 years ago. Patch testing 18 years ago as positive to nickel per patient report. No issues with other metals. No prior joint replacements.   Discussed with patient that patch testing tests for contact dermatitis and sometimes it does not correlate to how one will react to metals in the body. Positive patch testing results can help in avoiding those items however it is possible to get false negative results.  Nevertheless, this is the most accessible test for metal sensitivity currently available.   Metal patches placed today.  Please avoid strenuous physical activities and do not get the patches on the back wet. No showering until final patch reading done.  Okay to take antihistamines for itching but avoid placing any creams on the back where the patches are.  We will remove the patches on Wednesday and will do our initial read.  Then you will come back on Friday for a final read.

## 2021-07-22 NOTE — Patient Instructions (Addendum)
Discussed with patient that patch testing tests for contact dermatitis and sometimes it does not correlate to how one will react to metals in the body. Positive patch testing results can help in avoiding those items however it is possible to get false negative results.  Nevertheless, this is the most accessible test for metal sensitivity currently available.  Patches placed today. Please avoid strenuous physical activities and do not get the patches on the back wet. No showering until final patch reading done. Okay to take antihistamines for itching but avoid placing any creams on the back where the patches are. We will remove the patches on Wednesday and will do our initial read. Then you will come back on Friday for a final read.   

## 2021-07-24 ENCOUNTER — Other Ambulatory Visit: Payer: Self-pay

## 2021-07-24 ENCOUNTER — Encounter: Payer: Self-pay | Admitting: Allergy

## 2021-07-24 ENCOUNTER — Ambulatory Visit (INDEPENDENT_AMBULATORY_CARE_PROVIDER_SITE_OTHER): Payer: Medicare Other | Admitting: Allergy

## 2021-07-24 DIAGNOSIS — L239 Allergic contact dermatitis, unspecified cause: Secondary | ICD-10-CM | POA: Diagnosis not present

## 2021-07-24 NOTE — Progress Notes (Signed)
° ° °  Follow-up Note  RE: Jillian [name] MRN: [id number] DOB: 08/15/1950 Date of Office Visit: 07/24/2021  Primary care provider: Dois Davenport, MD Referring provider: Dois Davenport, MD   Jillian [name] returns to the office today for the initial patch test interpretation, given suspected history of contact dermatitis.    Diagnostics:  Metal test series 48 hour reading:   Metals Patch     Row Name 07/24/21 1204           Time Antigen Placed 1205       Manufacturer Greer       Location Back       Number of Test 13       Reading Interval Day 1       Aluminum Hydroxide 10% 0       Chromium chloride 1% 0       Cobalt chloride hexahydrate 1% 0       Molybdenum chloride 0.5% 0       Nickel sulfate hexahydrate 5% 0       Potassium dichromate 0.25% 0       Copper sulfate pentahydrate 2% 0       Tantal 1% 0       Titanium 0.1% 0       Manganese chloride 0.5% 0       Vanadium Pentoxide 10% 0       Other 0  paper clip                   Plan:  Allergic contact dermatitis 48-hour test results are negative to metals She will return in 2 days for final reading  Margo Aye, MD [facility]

## 2021-07-26 ENCOUNTER — Encounter: Payer: Self-pay | Admitting: Allergy

## 2021-07-26 ENCOUNTER — Other Ambulatory Visit: Payer: Self-pay

## 2021-07-26 ENCOUNTER — Ambulatory Visit: Payer: Medicare Other | Admitting: Allergy

## 2021-07-26 DIAGNOSIS — L230 Allergic contact dermatitis due to metals: Secondary | ICD-10-CM

## 2021-07-26 NOTE — Progress Notes (Signed)
Follow-up Note  RE: Jillian [name] MRN: [id number] DOB: 1950-12-11 Date of Office Visit: 07/26/2021  Primary care provider: Dois Davenport, MD Referring provider: Dois Davenport, MD   Genean returns to the office today for the final patch test interpretation, given suspected history of contact dermatitis.    Diagnostics:  Metal series 96 hour reading:   Metals Patch     Row Name 07/26/21 1135           Time Antigen Placed 1136       Manufacturer Greer       Location Back       Number of Test 13       Reading Interval Day 3       Aluminum Hydroxide 10% 0       Chromium chloride 1% 0       Cobalt chloride hexahydrate 1% 0       Molybdenum chloride 0.5% 0       Nickel sulfate hexahydrate 5% 2       Potassium dichromate 0.25% 0       Copper sulfate pentahydrate 2% 0       Tantal 1% 0       Titanium 0.1% 0       Manganese chloride 0.5% 0       Vanadium Pentoxide 10% 0       Other 0  paper clip                Metal series 48 hour reading from 07/24/21:   Metals Patch       Row Name 07/24/21 1204                    Time Antigen Placed 1205            Manufacturer Greer            Location Back            Number of Test 13            Reading Interval Day 1            Aluminum Hydroxide 10% 0            Chromium chloride 1% 0            Cobalt chloride hexahydrate 1% 0            Molybdenum chloride 0.5% 0            Nickel sulfate hexahydrate 5% 0            Potassium dichromate 0.25% 0            Copper sulfate pentahydrate 2% 0            Tantal 1% 0            Titanium 0.1% 0            Manganese chloride 0.5% 0            Vanadium Pentoxide 10% 0            Other 0  paper clip                Plan:  Allergic contact dermatitis Metal patch testing positive to Nickel sulfate.  Recommend avoidance of nickel based products The patient has been provided detailed information regarding the substances she is sensitive to, as well as products  containing the substances.  Meticulous avoidance of these substances is recommended. If avoidance is not possible, the use of barrier creams or lotions is recommended. Test results will be sent to Dr. Dahlia Byes, MD [facility]

## 2021-08-27 IMAGING — MR MR HEAD WO/W CM
11 of 12 series · 36 of 48 positions shown · IV contrast (multihance)
Comparison: Head MRI 06/01/2018. Head and maxillofacial CTs
06/30/2017.

CLINICAL DATA: Asymmetrical hearing loss. Vertigo for several
months.

EXAM:
MRI HEAD WITHOUT AND WITH CONTRAST
TECHNIQUE: Multiplanar, multiecho pulse sequences of the brain and surrounding
structures were obtained without and with intravenous contrast.
CONTRAST:  14mL MULTIHANCE GADOBENATE DIMEGLUMINE 529 MG/ML IV SOLN

[Series 2: T1 · sagittal · 5.0mm · 0.47mm/px · 3 of 21 slices shown (1 of 4)]
[im 1/21]
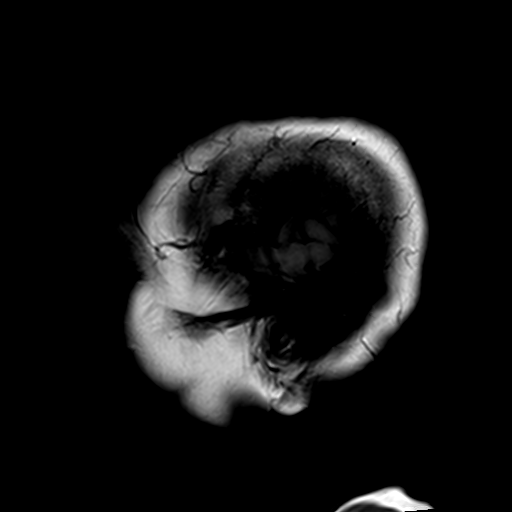
[im 11/21]
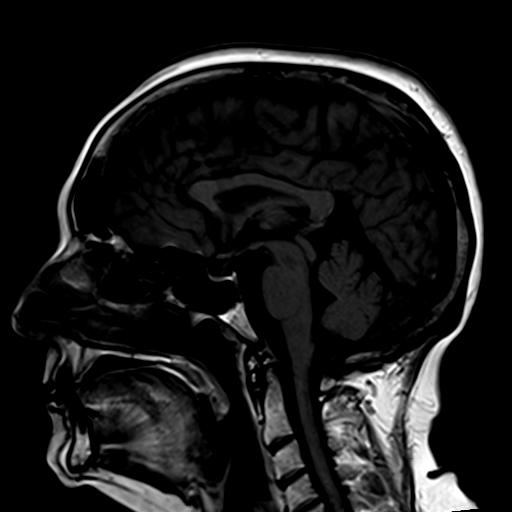
[im 21/21]
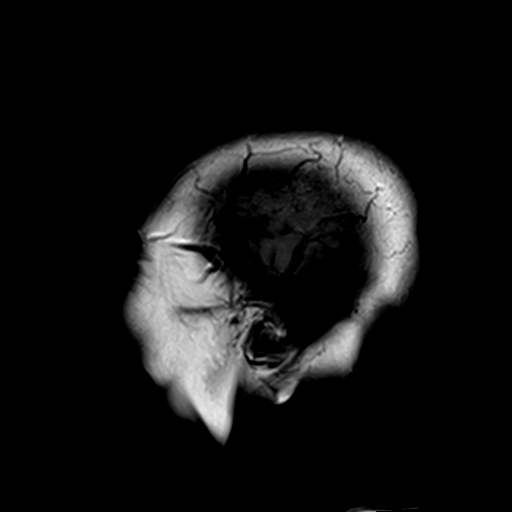

[Series 3: ep2d_diff_3 · axial · 3.0mm · 1.80mm/px · z∈[-45,+101]mm · 8 of 99 slices shown]
[im 1/99]
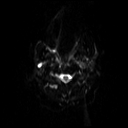
[im 11/99]
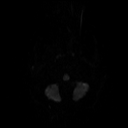
[im 33/99]
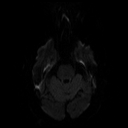
[im 44/99]
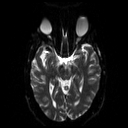
[im 55/99]
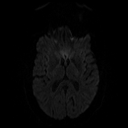
[im 66/99]
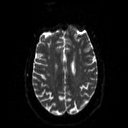
[im 88/99]
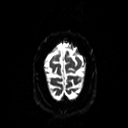
[im 99/99]
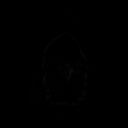

[Series 4: ep2d_diff_3_adc · axial · 3.0mm · 1.80mm/px · z∈[-45,+101]mm · 5 of 50 slices shown]
[im 1/50]
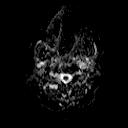
[im 13/50]
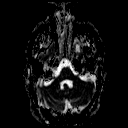
[im 25/50]
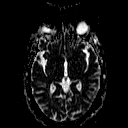
[im 37/50]
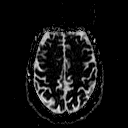
[im 50/50]
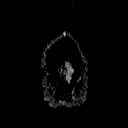

[Series 5: t2_tse_tra_512 · axial · 5.0mm · 0.60mm/px · z∈[-43,+101]mm · 2 of 24 slices shown]
[im 1/24]
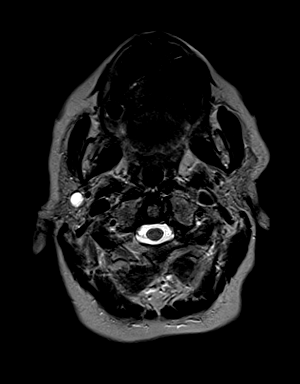
[im 24/24]
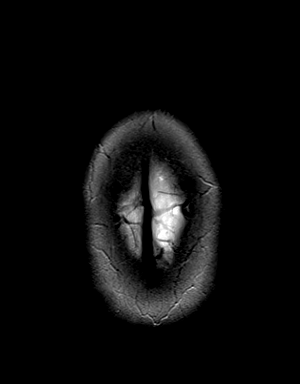

[Series 6: FLAIR · axial · 3.0mm · 0.45mm/px · z∈[-50,+106]mm · 3 of 27 slices shown]
[im 1/27]
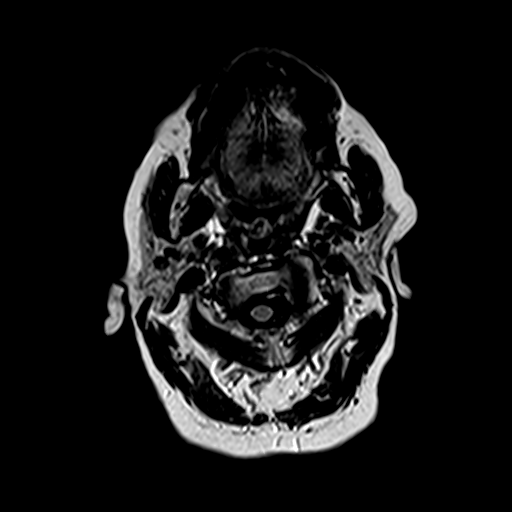
[im 14/27]
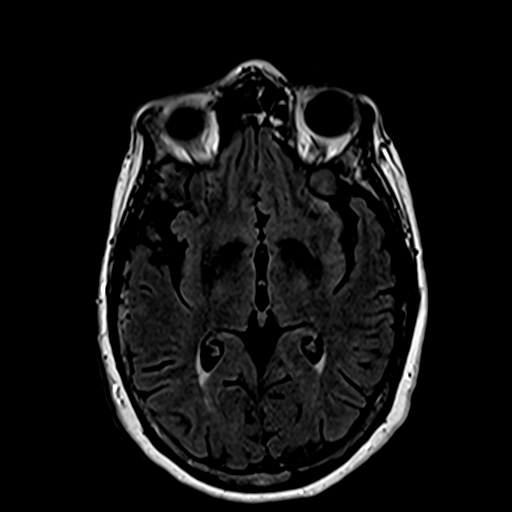
[im 27/27]
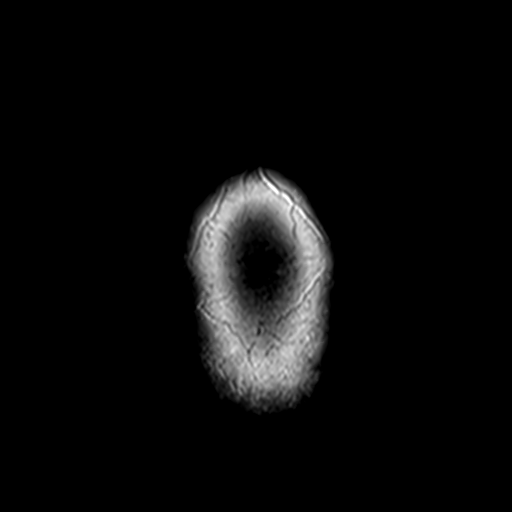

[Series 8: swi_images · axial · 2.0mm · 0.94mm/px · z∈[-44,+114]mm · 8 of 80 slices shown]
[im 1/80]
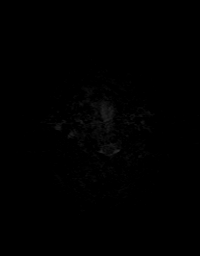
[im 12/80]
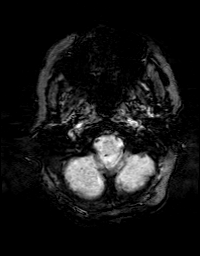
[im 23/80]
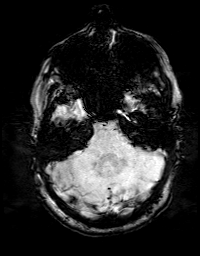
[im 34/80]
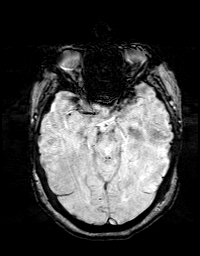
[im 46/80]
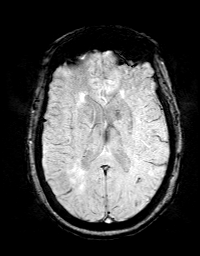
[im 57/80]
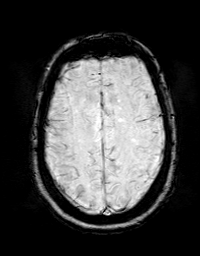
[im 68/80]
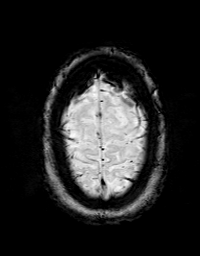
[im 80/80]
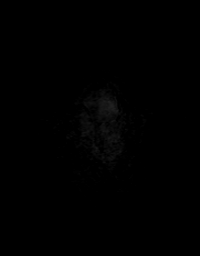

[Series 9: T1 · coronal · 3.0mm · 0.35mm/px · 1 of 11 slices shown (2 of 4)]
[im 1/11]
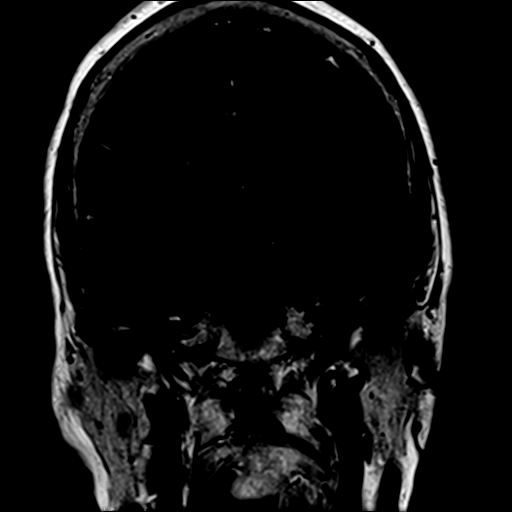

[Series 10: T1 · axial · 3.0mm · 0.35mm/px · 1 of 11 slices shown (3 of 4)]
[im 1/11]
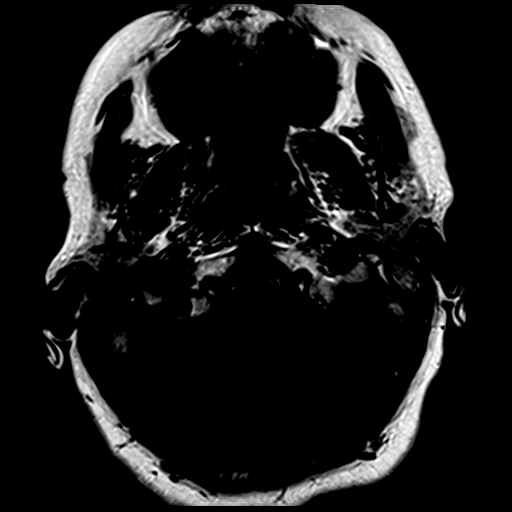

[Series 11: bSSFP · axial · 0.7mm · 0.28mm/px · z∈[-23,-8]mm · 3 of 44 slices shown]
[im 1/44]
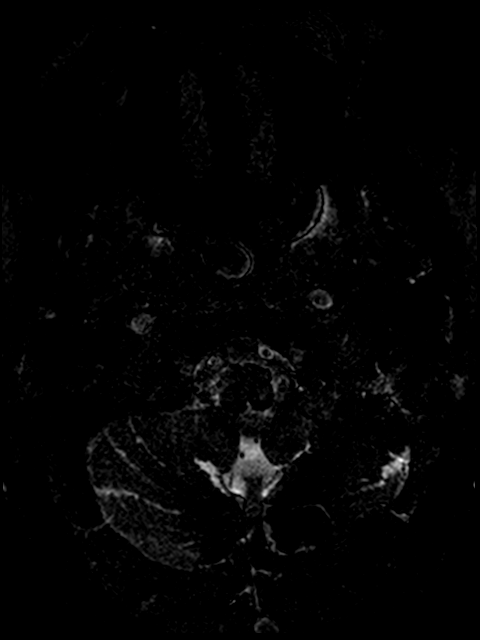
[im 11/44]
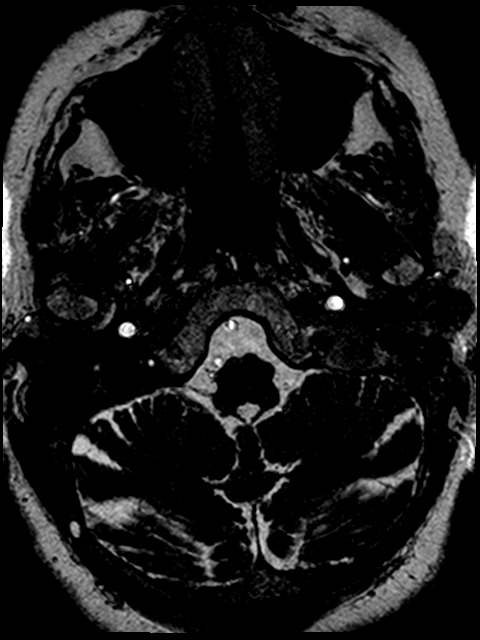
[im 22/44]
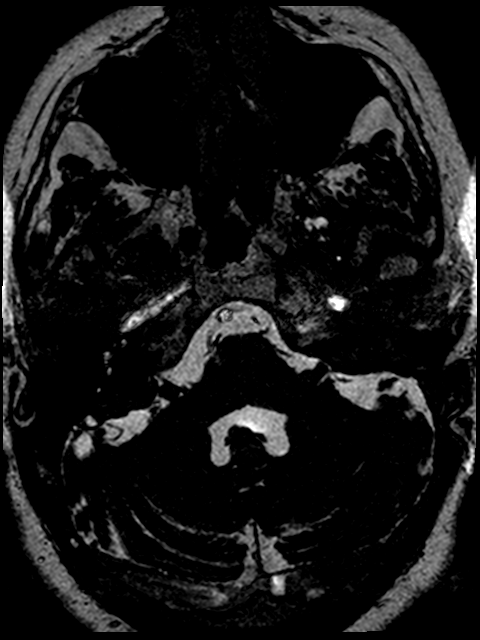

[Series 12: T1 · coronal · 3.0mm · 0.35mm/px · 1 of 11 slices shown (4 of 4)]
[im 1/11]
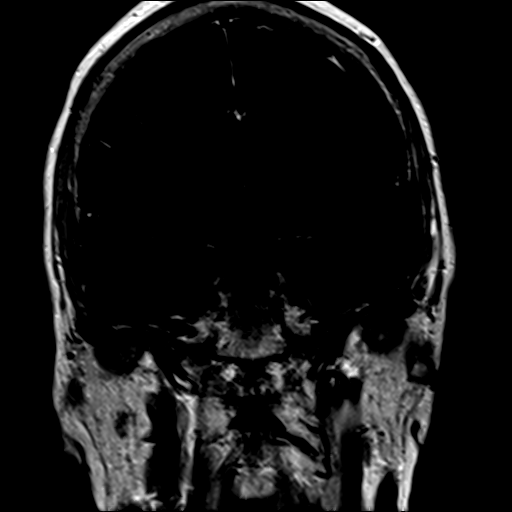

[Series 13: T1 post-contrast · axial · 3.0mm · 0.35mm/px · 1 of 11 slices shown]
[im 1/11]
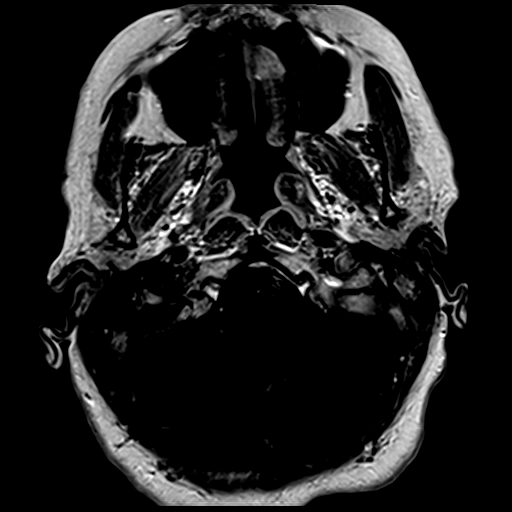

[36 of 48 positions shown; findings below may reference images not displayed]

FINDINGS: Brain: There is no evidence of an acute infarct, intracranial
hemorrhage, mass, midline shift, or extra-axial fluid collection.
Small T2 hyperintensities in the cerebral white matter bilaterally
are unchanged and nonspecific but compatible with mild-to-moderate
chronic small vessel ischemic disease. There is mild-to-moderate
cerebral and cerebellar atrophy. No abnormal enhancement is
identified.

There is a small chronic cephalocele involving the greater wing of
the sphenoid bone on the left with associated anterior temporal lobe
encephalomalacia. There is no extension into the sphenoid sinus.

Dedicated imaging through the internal auditory canals demonstrates
a normal course of cranial nerves VII and VIII without evidence of a
mass or abnormal enhancement. There is mild chronic asymmetric
narrowing of the right porus acusticus due to hyperostosis. The
inner ear structures demonstrate normal signal and morphology
bilaterally. No mass is present in the cerebellopontine angles.

Vascular: Major intracranial vascular flow voids are preserved.

Skull and upper cervical spine: Unremarkable bone marrow signal.

Sinuses/Orbits: Bilateral cataract extraction. Mild mucosal
thickening in the left sphenoid sinus, less than on the prior MRI.
No significant mastoid fluid.

Other: Unchanged 13 mm T2 hyperintense focus in the right parotid
gland.
IMPRESSION: 1. Unremarkable internal auditory canal imaging aside from mild
chronic bony narrowing of the right porus acusticus.
2. No acute intracranial abnormality.
3. Mild-to-moderate chronic small vessel ischemic disease and
cerebral atrophy.
4. Chronic left greater wing sphenoid wing cephalocele.

## 2021-09-06 NOTE — Patient Instructions (Signed)
DUE TO COVID-19 ONLY ONE VISITOR  (aged 70 and older)  IS ALLOWED TO COME WITH YOU AND STAY IN THE WAITING ROOM ONLY DURING PRE OP AND PROCEDURE.   ?**NO VISITORS ARE ALLOWED IN THE SHORT STAY AREA OR RECOVERY ROOM!!** ? ?IF YOU WILL BE ADMITTED INTO THE HOSPITAL YOU ARE ALLOWED ONLY TWO SUPPORT PEOPLE DURING VISITATION HOURS ONLY (7 AM -8PM)   ?The support person(s) must pass our screening, gel in and out, and wear a mask at all times, including in the patient?s room. ?Patients must also wear a mask when staff or their support person are in the room. ?Visitors GUEST BADGE MUST BE WORN VISIBLY  ?One adult visitor may remain with you overnight and MUST be in the room by 8 P.M. ?  ? ? Your procedure is scheduled on: 09/23/21 ? ? Report to [facility] Main Entrance ? ?  Report to admitting at : 6:45 AM ? ? Call this number if you have problems the morning of surgery [phone] ? ? Do not eat food :After Midnight. ? ? After Midnight you may have the following liquids until : 6:15 AM  DAY OF SURGERY ? ?Water ?Black Coffee (sugar ok, NO MILK/CREAM OR CREAMERS)  ?Tea (sugar ok, NO MILK/CREAM OR CREAMERS) regular and decaf                             ?Plain Jell-O (NO RED)                                           ?Fruit ices (not with fruit pulp, NO RED)                                     ?Popsicles (NO RED)                                                                  ?Juice: apple, WHITE grape, WHITE cranberry ?Sports drinks like Gatorade (NO RED) ?Clear broth(vegetable,chicken,beef) ? ?             ?Drink  Ensure drink AT: 6:15 AM the day of surgery.   ?  ?The day of surgery:  ?Drink ONE (1) Pre-Surgery Clear Ensure or G2 at AM the morning of surgery. Drink in one sitting. Do not sip.  ?This drink was given to you during your hospital  ?pre-op appointment visit. ?Nothing else to drink after completing the  ?Pre-Surgery Clear Ensure or G2. ?  ?       If you have questions, please contact your surgeon?s  office.  ?  ?Oral Hygiene is also important to reduce your risk of infection.                                    ?Remember - BRUSH YOUR TEETH THE MORNING OF SURGERY WITH YOUR REGULAR TOOTHPASTE ? ? Do NOT smoke after Midnight ? ? Take these medicines the morning  of surgery with A SIP OF WATER: Quetiapine,propanolol,famotidine.Clonazepam as needed. ? ?DO NOT TAKE ANY ORAL DIABETIC MEDICATIONS DAY OF YOUR SURGERY ? ?Bring CPAP mask and tubing day of surgery. ?                  ?           You may not have any metal on your body including hair pins, jewelry, and body piercing ? ?           Do not wear make-up, lotions, powders, perfumes/cologne, or deodorant ? ?Do not wear nail polish including gel and S&S, artificial/acrylic nails, or any other type of covering on natural nails including finger and toenails. If you have artificial nails, gel coating, etc. that needs to be removed by a nail salon please have this removed prior to surgery or surgery may need to be canceled/ delayed if the surgeon/ anesthesia feels like they are unable to be safely monitored.  ? ?Do not shave  48 hours prior to surgery. ? ? Do not bring valuables to the hospital. [facility] IS NOT ?            RESPONSIBLE   FOR VALUABLES. ? ? Contacts, dentures or bridgework may not be worn into surgery. ? ? Bring small overnight bag day of surgery. ?  ? Patients discharged on the day of surgery will not be allowed to drive home.  Someone NEEDS to stay with you for the first 24 hours after anesthesia. ? ? Special Instructions: Bring a copy of your healthcare power of attorney and living will documents         the day of surgery if you haven't scanned them before. ? ?            Please read over the following fact sheets you were given: IF YOU HAVE QUESTIONS ABOUT YOUR PRE-OP INSTRUCTIONS PLEASE CALL [phone] ? ?   [facility] - Preparing for Surgery ?Before surgery, you can play an important role.  Because skin is not sterile, your skin needs to be  as free of germs as possible.  You can reduce the number of germs on your skin by washing with CHG (chlorahexidine gluconate) soap before surgery.  CHG is an antiseptic cleaner which kills germs and bonds with the skin to continue killing germs even after washing. ?Please DO NOT use if you have an allergy to CHG or antibacterial soaps.  If your skin becomes reddened/irritated stop using the CHG and inform your nurse when you arrive at Short Stay. ?Do not shave (including legs and underarms) for at least 48 hours prior to the first CHG shower.  You may shave your face/neck. ?Please follow these instructions carefully: ? 1.  Shower with CHG Soap the night before surgery and the  morning of Surgery. ? 2.  If you choose to wash your hair, wash your hair first as usual with your  normal  shampoo. ? 3.  After you shampoo, rinse your hair and body thoroughly to remove the  shampoo.                           4.  Use CHG as you would any other liquid soap.  You can apply chg directly  to the skin and wash  ?                     Gently with a scrungie  or clean washcloth. ? 5.  Apply the CHG Soap to your body ONLY FROM THE NECK DOWN.   Do not use on face/ open      ?                     Wound or open sores. Avoid contact with eyes, ears mouth and genitals (private parts).  ?                     Engineering geologistWash face,  Genitals (private parts) with your normal soap. ?            6.  Wash thoroughly, paying special attention to the area where your surgery  will be performed. ? 7.  Thoroughly rinse your body with warm water from the neck down. ? 8.  DO NOT shower/wash with your normal soap after using and rinsing off  the CHG Soap. ?               9.  Pat yourself dry with a clean towel. ?           10.  Wear clean pajamas. ?           11.  Place clean sheets on your bed the night of your first shower and do not  sleep with pets. ?Day of Surgery : ?Do not apply any lotions/deodorants the morning of surgery.  Please wear clean clothes to the  hospital/surgery center. ? ?FAILURE TO FOLLOW THESE INSTRUCTIONS MAY RESULT IN THE CANCELLATION OF YOUR SURGERY ?PATIENT SIGNATURE_________________________________ ? ?NURSE SIGNATURE__________________________________ ? ?________________________________________________________________________  ? ?Incentive Spirometer ? ?An incentive spirometer is a tool that can help keep your lungs clear and active. This tool measures how well you are filling your lungs with each breath. Taking long deep breaths may help reverse or decrease the chance of developing breathing (pulmonary) problems (especially infection) following: ?A long period of time when you are unable to move or be active. ?BEFORE THE PROCEDURE  ?If the spirometer includes an indicator to show your best effort, your nurse or respiratory therapist will set it to a desired goal. ?If possible, sit up straight or lean slightly forward. Try not to slouch. ?Hold the incentive spirometer in an upright position. ?INSTRUCTIONS FOR USE  ?Sit on the edge of your bed if possible, or sit up as far as you can in bed or on a chair. ?Hold the incentive spirometer in an upright position. ?Breathe out normally. ?Place the mouthpiece in your mouth and seal your lips tightly around it. ?Breathe in slowly and as deeply as possible, raising the piston or the ball toward the top of the column. ?Hold your breath for 3-5 seconds or for as long as possible. Allow the piston or ball to fall to the bottom of the column. ?Remove the mouthpiece from your mouth and breathe out normally. ?Rest for a few seconds and repeat Steps 1 through 7 at least 10 times every 1-2 hours when you are awake. Take your time and take a few normal breaths between deep breaths. ?The spirometer may include an indicator to show your best effort. Use the indicator as a goal to work toward during each repetition. ?After each set of 10 deep breaths, practice coughing to be sure your lungs are clear. If you have an  incision (the cut made at the time of surgery), support your incision when coughing by placing a pillow or rolled up towels firmly against it. ?Once  you are able to get out of bed, walk around indoors and cou

## 2021-09-10 ENCOUNTER — Other Ambulatory Visit: Payer: Self-pay

## 2021-09-10 ENCOUNTER — Encounter (HOSPITAL_COMMUNITY)
Admission: RE | Admit: 2021-09-10 | Discharge: 2021-09-10 | Disposition: A | Discharge status: Home / Self Care | Payer: Medicare Other | Source: Ambulatory Visit | Attending: Orthopedic Surgery | Admitting: Orthopedic Surgery

## 2021-09-10 ENCOUNTER — Encounter (HOSPITAL_COMMUNITY): Payer: Self-pay

## 2021-09-10 VITALS — BP 130/80 | HR 68 | Temp 98.0°F | Resp 16 | Ht 63.0 in | Wt 163.0 lb

## 2021-09-10 DIAGNOSIS — K219 Gastro-esophageal reflux disease without esophagitis: Secondary | ICD-10-CM | POA: Diagnosis not present

## 2021-09-10 DIAGNOSIS — M1711 Unilateral primary osteoarthritis, right knee: Secondary | ICD-10-CM | POA: Diagnosis not present

## 2021-09-10 DIAGNOSIS — I2510 Atherosclerotic heart disease of native coronary artery without angina pectoris: Secondary | ICD-10-CM | POA: Insufficient documentation

## 2021-09-10 DIAGNOSIS — Z01812 Encounter for preprocedural laboratory examination: Secondary | ICD-10-CM | POA: Insufficient documentation

## 2021-09-10 DIAGNOSIS — Z01818 Encounter for other preprocedural examination: Secondary | ICD-10-CM

## 2021-09-10 LAB — CBC
HCT: 47.9 % — ABNORMAL HIGH (ref 36.0–46.0)
Hemoglobin: 15.4 g/dL — ABNORMAL HIGH (ref 12.0–15.0)
MCH: 27.9 pg (ref 26.0–34.0)
MCHC: 32.2 g/dL (ref 30.0–36.0)
MCV: 86.8 fL (ref 80.0–100.0)
Platelets: 206 10*3/uL (ref 150–400)
RBC: 5.52 MIL/uL — ABNORMAL HIGH (ref 3.87–5.11)
RDW: 14 % (ref 11.5–15.5)
WBC: 8.4 10*3/uL (ref 4.0–10.5)
nRBC: 0 % (ref 0.0–0.2)

## 2021-09-10 LAB — COMPREHENSIVE METABOLIC PANEL
ALT: 17 U/L (ref 0–44)
AST: 18 U/L (ref 15–41)
Albumin: 4 g/dL (ref 3.5–5.0)
Alkaline Phosphatase: 80 U/L (ref 38–126)
Anion gap: 8 (ref 5–15)
BUN: 12 mg/dL (ref 8–23)
CO2: 26 mmol/L (ref 22–32)
Calcium: 9.3 mg/dL (ref 8.9–10.3)
Chloride: 102 mmol/L (ref 98–111)
Creatinine, Ser: 0.58 mg/dL (ref 0.44–1.00)
GFR, Estimated: >60 mL/min (ref >60)
Glucose, Bld: 85 mg/dL (ref 70–99)
Potassium: 4.2 mmol/L (ref 3.5–5.1)
Sodium: 136 mmol/L (ref 135–145)
Total Bilirubin: 0.4 mg/dL (ref 0.3–1.2)
Total Protein: 6.8 g/dL (ref 6.5–8.1)

## 2021-09-10 LAB — SURGICAL PCR SCREEN
MRSA, PCR: NEGATIVE
Staphylococcus aureus: NEGATIVE

## 2021-09-10 NOTE — Progress Notes (Signed)
For Short Stay: ?[facility] appointment date: ?Date of COVID positive in last 90 days: ?COVID Vaccine: Pfizer x 2.: 09/28/19 ?Bowel Prep reminder: ? ? ?For Anesthesia: ?PCP - Dr. Horald Pollen ?Cardiologist - Dr. Shelva Majestic . Clearance: Dayna Dunn: PA.: 05/30/21: Chart. ? ?Chest x-ray - 06/13/21 ?EKG - 01/16/21 ?Stress Test -  ?ECHO - 01/29/21 ?Cardiac Cath - 1994 ?Pacemaker/ICD device last checked: ?Pacemaker orders received: ?Device Rep notified: ? ?Spinal Cord Stimulator: ? ?Sleep Study -  ?CPAP -  ? ?Fasting Blood Sugar -  ?Checks Blood Sugar _____ times a day ?Date and result of last Hgb A1c- ? ?Blood Thinner Instructions: ?Aspirin Instructions: ?Last Dose: ? ?Activity level: Can go up a flight of stairs and activities of daily living without stopping and without chest pain and/or shortness of breath ?  Able to exercise without chest pain and/or shortness of breath ?  Unable to go up a flight of stairs without chest pain and/or shortness of breath ?   ? ?Anesthesia review: Hx: CAD,Smoker,Heart attack. ? ?Patient denies shortness of breath, fever, cough and chest pain at PAT appointment ? ? ?Patient verbalized understanding of instructions that were given to them at the PAT appointment. Patient was also instructed that they will need to review over the PAT instructions again at home before surgery.  ?

## 2021-09-11 NOTE — Anesthesia Preprocedure Evaluation (Addendum)
Anesthesia Evaluation  ?Patient identified by MRN, date of birth, ID band ?Patient awake ? ? ? ?Reviewed: ?Allergy & Precautions, NPO status , Patient's Chart, lab work & pertinent test results, reviewed documented beta blocker date and time  ? ?History of Anesthesia Complications ?(+) PONV and history of anesthetic complications ? ?Airway ?Mallampati: II ? ?TM Distance: >3 FB ?Neck ROM: Full ? ? ? Dental ?no notable dental hx. ?(+) Caps, Dental Advisory Given ?  ?Pulmonary ?Current Smoker and Patient abstained from smoking.,  ?  ?Pulmonary exam normal ?breath sounds clear to auscultation ? ? ? ? ? ? Cardiovascular ?hypertension, Pt. on medications and Pt. on home beta blockers ?+ CAD, + Past MI and + Peripheral Vascular Disease  ?Normal cardiovascular exam ?Rhythm:Regular Rate:Normal ? ?Echo 01/26/21 ?1. Left ventricular ejection fraction, by estimation, is 55 to 60%. The left ventricle has normal function. The left ventricle has no regional wall motion abnormalities. Left ventricular diastolic parameters are consistent with Grade I diastolic dysfunction (impaired relaxation). Elevated left ventricular end-diastolic pressure. The average left ventricular global longitudinal strain is -21.3 %. The global longitudinal strain is normal.  ??2. Right ventricular systolic function is normal. The right ventricular size is normal. There is normal pulmonary artery systolic pressure.  ??3. The mitral valve is normal in structure. Trivial mitral valve regurgitation. No evidence of mitral stenosis.  ??4. The aortic valve is tricuspid. Aortic valve regurgitation is not visualized. No aortic stenosis is present.  ??5. The inferior vena cava is normal in size with greater than 50% respiratory variability, suggesting right atrial pressure of 3 mmHg.  ? ?EKG 01/16/21 ?NSR, possible inferior and anterior infarcts ?  ?Neuro/Psych ? Headaches, PSYCHIATRIC DISORDERS Anxiety Depression   ? GI/Hepatic ?Neg  liver ROS, GERD  Controlled and Medicated,  ?Endo/Other  ?negative endocrine ROS ? Renal/GU ?negative Renal ROS  ?negative genitourinary ?  ?Musculoskeletal ? ?(+) Arthritis , Osteoarthritis,  OA right knee  ? Abdominal ?  ?Peds ? Hematology ?negative hematology ROS ?(+)   ?Anesthesia Other Findings ? ? Reproductive/Obstetrics ? ?  ? ? ? ? ? ? ? ? ? ? ? ? ? ?  ?  ? ? ? ? ? ? ? ?Anesthesia Physical ?Anesthesia Plan ? ?ASA: 2 ? ?Anesthesia Plan: Spinal  ? ?Post-op Pain Management: Regional block*  ? ?Induction: Intravenous ? ?PONV Risk Score and Plan: 3 and Treatment may vary due to age or medical condition and Propofol infusion ? ?Airway Management Planned: Natural Airway and Simple Face Mask ? ?Additional Equipment:  ? ?Intra-op Plan:  ? ?Post-operative Plan:  ? ?Informed Consent: I have reviewed the patients History and Physical, chart, labs and discussed the procedure including the risks, benefits and alternatives for the proposed anesthesia with the patient or authorized representative who has indicated his/her understanding and acceptance.  ? ? ? ?Dental advisory given ? ?Plan Discussed with: Anesthesiologist and CRNA ? ?Anesthesia Plan Comments: (See PAT note 09/10/2021, Jodell Cipro Ward, PA-C)  ? ? ? ? ? ?Anesthesia Quick Evaluation ? ?

## 2021-09-11 NOTE — Progress Notes (Signed)
Anesthesia Chart Review ? ? Case: [id number] Date/Time: 09/23/21 0910  ? Procedure: TOTAL KNEE ARTHROPLASTY (Right: Knee)  ? Anesthesia type: Choice  ? Pre-op diagnosis: right knee osteoarthritis  ? Location: [facility] ROOM 09 / [facility]  ? Surgeons: Ollen Gross, MD  ? ?  ? ? ?DISCUSSION:71 y.o. smoker with h/o PONV, GERD, CAD, right knee OA scheduled for above procedure 09/23/2021 with Dr. Ollen Gross.  ? ?Per cardiology preoperative evaluation 05/30/2021, "Chart reviewed as part of pre-operative protocol coverage. Patient was contacted 05/30/2021 in reference to pre-operative risk assessment for pending surgery as outlined below.  ANISHA STARLIPER was last seen 01/2021 by Dr. Tresa Endo. Per Dr. Tresa Endo, "Since patient has been entirely asymptomatic since her intervention in 1994 and has not had recurrent symptomatology or repeat procedures probably okay to proceed with elective knee surgery." I relayed this to the patient. The patient was advised that if she develops new symptoms prior to surgery to contact our office to arrange for a follow-up visit, and she verbalized understanding." ? ?Anticipate pt can proceed with planned procedure barring acute status change.   ?VS: BP 130/80   Pulse 68   Temp 36.7 ?C (Oral)   Resp 16   Ht 5\' 3"  (1.6 m)   Wt 73.9 kg   SpO2 98%   BMI 28.87 kg/m?  ? ?PROVIDERS: ? , MD is PCP  ? ?Primary Cardiologist: Dr. Dois Davenport ?LABS: Labs reviewed: Acceptable for surgery. ?(all labs ordered are listed, but only abnormal results are displayed) ? ?Labs Reviewed  ?CBC - Abnormal; Notable for the following components:  ?    Result Value  ? RBC 5.52 (*)   ? Hemoglobin 15.4 (*)   ? HCT 47.9 (*)   ? All other components within normal limits  ?SURGICAL PCR SCREEN  ?COMPREHENSIVE METABOLIC PANEL  ? ? ? ?IMAGES: ? ? ?EKG: ?01/16/21 ?Rate 60 bpm  ?NSR ?Low voltage QRS ?Possible inferior infarct, age undetermined ?Cannot rule out anterior infarct, age undetermined ? ?CV: ?Echo 01/29/2021 ?1. Left  ventricular ejection fraction, by estimation, is 55 to 60%. The  ?left ventricle has normal function. The left ventricle has no regional  ?wall motion abnormalities. Left ventricular diastolic parameters are  ?consistent with Grade I diastolic  ?dysfunction (impaired relaxation). Elevated left ventricular end-diastolic  ?pressure. The average left ventricular global longitudinal strain is -21.3  ?%. The global longitudinal strain is normal.  ? 2. Right ventricular systolic function is normal. The right ventricular  ?size is normal. There is normal pulmonary artery systolic pressure.  ? 3. The mitral valve is normal in structure. Trivial mitral valve  ?regurgitation. No evidence of mitral stenosis.  ? 4. The aortic valve is tricuspid. Aortic valve regurgitation is not  ?visualized. No aortic stenosis is present.  ? 5. The inferior vena cava is normal in size with greater than 50%  ?respiratory variability, suggesting right atrial pressure of 3 mmHg.  ?Past Medical History:  ?Diagnosis Date  ? Adjustment disorder, unspecified   ? Arthritis   ? Coronary artery disease   ? Edema leg   ? GAD (generalized anxiety disorder)   ? GERD (gastroesophageal reflux disease)   ? Myocardial infarction [facility]) 1995  ? saw [facility]   ? PONV (postoperative nausea and vomiting)   ? Tremor, unspecified   ? Unspecified mood (affective) disorder (HCC)   ? 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 ? ?Past Surgical History:  ?Procedure Laterality Date  ? CARDIAC CATHETERIZATION    ?  COLONOSCOPY W/ POLYPECTOMY  06/17/2011  ? KNEE ARTHROSCOPY Right 11/23/2012  ? Procedure: RIGHT KNEE ARTHROSCOPY PARTIAL MEDIAL MENISECTOMY CHRONDROPLASTY;  Surgeon: Velna Ochs, MD;  Location: [facility];  Service: Orthopedics;  Laterality: Right;  ? TUBAL LIGATION  06/17/1971  ? VEIN SURGERY    ? ? ?MEDICATIONS: ? acetaminophen (TYLENOL) 500 MG tablet  ? clonazePAM (KLONOPIN) 0.5 MG tablet  ? docusate sodium (COLACE) 100 MG capsule  ? famotidine  (PEPCID) 20 MG tablet  ? ibuprofen (ADVIL) 800 MG tablet  ? meclizine (ANTIVERT) 25 MG tablet  ? Multiple Vitamins-Minerals (PRESERVISION AREDS 2 PO)  ? Polyethyl Glycol-Propyl Glycol (SYSTANE OP)  ? propranolol ER (INDERAL LA) 60 MG 24 hr capsule  ? QUEtiapine (SEROQUEL) 100 MG tablet  ? Vitamin D, Ergocalciferol, (DRISDOL) 1.25 MG (50000 UNIT) CAPS capsule  ? ?No current facility-administered medications for this encounter.  ? ? ? ?Jodell Cipro Ward, PA-C ?[facility] ?[phone] ? ? ? ? ? ?

## 2021-09-18 NOTE — H&P (Signed)
TOTAL KNEE ADMISSION H&P ? ?Patient is being admitted for right total knee arthroplasty. ? ?Subjective: ? ?Chief Complaint: Right knee pain. ? ?HPI: Jillian [name], 71 y.o. female has a history of pain and functional disability in the right knee due to arthritis and has failed non-surgical conservative treatments for greater than 12 weeks to include NSAID's and/or analgesics and activity modification. Onset of symptoms was gradual, starting  several  years ago with gradually worsening course since that time. The patient noted no past surgery on the right knee.  Patient currently rates pain in the right knee at 7 out of 10 with activity. Patient has night pain, worsening of pain with activity and weight bearing, pain that interferes with activities of daily living, and crepitus. Patient has evidence of periarticular osteophytes and joint space narrowing by imaging studies. There is no active infection. ? ?Patient Active Problem List  ? Diagnosis Date Noted  ? Allergic contact dermatitis 07/22/2021  ? Closed fracture of left patella 06/30/2017  ? Pre-diabetes 12/20/2015  ? Generalized anxiety disorder 09/20/2015  ? Major depressive disorder, recurrent episode, moderate (HCC) 09/20/2015  ? Venous insufficiency (chronic) (peripheral) 09/12/2015  ? Anxiety 09/04/2015  ? Insomnia 09/04/2015  ? 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 Gastroesophageal reflux disease without esophagitis 05/22/2015  ? Osteopenia 05/22/2015  ? Panic attacks 05/22/2015  ? Recurrent major depressive disorder (HCC) 05/22/2015  ? Tremor 05/22/2015  ? 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 LABYRINTHITIS 11/17/2007  ? HEADACHE 11/17/2007  ? ? ?Past Medical History:  ?Diagnosis Date  ? Adjustment disorder, unspecified   ? Arthritis   ? Coronary artery disease   ? Edema leg   ? GAD (generalized anxiety disorder)   ? GERD (gastroesophageal reflux disease)   ? Myocardial infarction [facility]) 1995  ? saw dr Tresa Endo   ?  PONV (postoperative nausea and vomiting)   ? Tremor, unspecified   ? Unspecified mood (affective) disorder (HCC)   ? 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 ? ?Past Surgical History:  ?Procedure Laterality Date  ? CARDIAC CATHETERIZATION    ? COLONOSCOPY W/ POLYPECTOMY  06/17/2011  ? KNEE ARTHROSCOPY Right 11/23/2012  ? Procedure: RIGHT KNEE ARTHROSCOPY PARTIAL MEDIAL MENISECTOMY CHRONDROPLASTY;  Surgeon: Velna Ochs, MD;  Location:  SURGERY CENTER;  Service: Orthopedics;  Laterality: Right;  ? TUBAL LIGATION  06/17/1971  ? VEIN SURGERY    ? ? ?Prior to Admission medications   ?Medication Sig Start Date End Date Taking? Authorizing Provider  ?acetaminophen (TYLENOL) 500 MG tablet Take 500 mg by mouth every 6 (six) hours as needed for moderate pain.   Yes [provider]  ?clonazePAM (KLONOPIN) 0.5 MG tablet Take 0.5 mg by mouth daily as needed for anxiety. 07/07/21  Yes [provider]  ?docusate sodium (COLACE) 100 MG capsule Take 100 mg by mouth daily.   Yes [provider]  ?famotidine (PEPCID) 20 MG tablet Take 20 mg by mouth daily as needed for indigestion.   Yes [provider]  ?meclizine (ANTIVERT) 25 MG tablet Take 25 mg by mouth 3 (three) times daily as needed for dizziness.   Yes [provider]  ?Multiple Vitamins-Minerals (PRESERVISION AREDS 2 PO) Take 1 tablet by mouth in the morning and at bedtime.   Yes [provider]  ?Polyethyl Glycol-Propyl Glycol (SYSTANE OP) Place 1 drop into both eyes in the morning and at bedtime.   Yes [provider]  ?  propranolol ER (INDERAL LA) 60 MG 24 hr capsule Take 60 mg by mouth daily. 02/13/20  Yes [provider]  ?QUEtiapine (SEROQUEL) 100 MG tablet Take 100 mg by mouth at bedtime.    Yes [provider]  ?Vitamin D, Ergocalciferol, (DRISDOL) 1.25 MG (50000 UNIT) CAPS capsule Take 50,000 Units by mouth every Wednesday. 11/09/20  Yes [provider]  ?ibuprofen (ADVIL) 800  MG tablet Take 1 tablet (800 mg total) by mouth every 8 (eight) hours as needed (pain). ?Patient not taking: Reported on 07/22/2021 06/13/21   Zenia Resides, MD  ? ? ?Allergies  ?Allergen Reactions  ? Hydrocodone Nausea And Vomiting  ? Opium Nausea And Vomiting  ?  Patient states all narcotics make her N/V  ? Nickel Rash  ?  Severe rash  ? ? ?Social History  ? ?Socioeconomic History  ? Marital status: Divorced  ?  Spouse name: Not on file  ? Number of children: Not on file  ? Years of education: Not on file  ? Highest education level: Not on file  ?Occupational History  ? Not on file  ?Tobacco Use  ? Smoking status: Every Day  ?  Packs/day: 1.00  ?  Years: 40.00  ?  Pack years: 40.00  ?  Types: Cigarettes  ? Smokeless tobacco: Never  ?Vaping Use  ? Vaping Use: Never used  ?Substance and Sexual Activity  ? Alcohol use: Yes  ?  Comment: rare  ? Drug use: No  ? Sexual activity: Not on file  ?Other Topics Concern  ? Not on file  ?Social History Narrative  ? Not on file  ? ?Social Determinants of Health  ? ?Financial Resource Strain: Not on file  ?Food Insecurity: Not on file  ?Transportation Needs: Not on file  ?Physical Activity: Not on file  ?Stress: Not on file  ?Social Connections: Not on file  ?Intimate Partner Violence: Not on file  ? ? ?Tobacco Use: High Risk  ? Smoking Tobacco Use: Every Day  ? Smokeless Tobacco Use: Never  ? Passive Exposure: Not on file  ? ?Social History  ? ?Substance and Sexual Activity  ?Alcohol Use Yes  ? Comment: rare  ? ? ?No family history on file. ? ?Review of Systems  ?Constitutional:  Negative for chills and fever.  ?HENT:  Negative for congestion, sore throat and tinnitus.   ?Eyes:  Negative for double vision, photophobia and pain.  ?Respiratory:  Negative for cough, shortness of breath and wheezing.   ?Cardiovascular:  Negative for chest pain, palpitations and orthopnea.  ?Gastrointestinal:  Negative for heartburn, nausea and vomiting.  ?Genitourinary:  Negative for dysuria,  frequency and urgency.  ?Musculoskeletal:  Positive for joint pain.  ?Neurological:  Negative for dizziness, weakness and headaches.  ? ?Objective: ? ?Physical Exam: ?Well nourished and well developed.  ?General: Alert and oriented x3, cooperative and pleasant, no acute distress.  ?Head: normocephalic, atraumatic, neck supple.  ?Eyes: EOMI.  ?Musculoskeletal: ?  ? Right Knee Exam:  ? No effusion present. No swelling present.  ? Varus deformity.  ? The range of motion is: 5 to 120 degrees.  ? Marked crepitus on range of motion of the knee.  ? Positive medial joint line tenderness.  ? No lateral joint line tenderness.  ? The knee is stable.  ? ?Calves soft and nontender. Motor function intact in LE. Strength 5/5 LE bilaterally. ?Neuro: Distal pulses 2+. Sensation to light touch intact in LE. ? ?Imaging Review ?Plain radiographs demonstrate  severe degenerative joint disease of the right knee. The overall alignment is mild varus. The bone quality appears to be adequate for age and reported activity level. ? ?Assessment/Plan: ? ?End stage arthritis, right knee  ? ?The patient history, physical examination, clinical judgment of the provider and imaging studies are consistent with end stage degenerative joint disease of the right knee and total knee arthroplasty is deemed medically necessary. The treatment options including medical management, injection therapy arthroscopy and arthroplasty were discussed at length. The risks and benefits of total knee arthroplasty were presented and reviewed. The risks due to aseptic loosening, infection, stiffness, patella tracking problems, thromboembolic complications and other imponderables were discussed. The patient acknowledged the explanation, agreed to proceed with the plan and consent was signed. Patient is being admitted for inpatient treatment for surgery, pain control, PT, OT, prophylactic antibiotics, VTE prophylaxis, progressive ambulation and ADLs and discharge planning.  The patient is planning to be discharged  home . ? ? ?Patient's anticipated LOS is less than 2 midnights, meeting these requirements: ?- Lives within 1 hour of care ?- Has a competent adult at home to re

## 2021-09-20 NOTE — Progress Notes (Signed)
Called patient about time change for surgery on 09/23/21. To arrive 1015 to admitting for 1305 surgery. ERAS drink rescheduled to 1000. Pt wrote down instructions and repeated to Clinical research associate. ?

## 2021-09-23 ENCOUNTER — Ambulatory Visit (HOSPITAL_COMMUNITY): Payer: Medicare Other | Admitting: Physician Assistant

## 2021-09-23 ENCOUNTER — Encounter (HOSPITAL_COMMUNITY): Payer: Self-pay | Admitting: Orthopedic Surgery

## 2021-09-23 ENCOUNTER — Ambulatory Visit (HOSPITAL_COMMUNITY): Payer: Medicare Other | Admitting: Certified Registered"

## 2021-09-23 ENCOUNTER — Other Ambulatory Visit: Payer: Self-pay

## 2021-09-23 ENCOUNTER — Inpatient Hospital Stay (HOSPITAL_COMMUNITY)
Admission: RE | Admit: 2021-09-23 | Discharge: 2021-09-25 | DRG: 470 | Disposition: A | Discharge status: Home / Self Care | Payer: Medicare Other | Source: Ambulatory Visit | Attending: Orthopedic Surgery | Admitting: Orthopedic Surgery

## 2021-09-23 ENCOUNTER — Encounter (HOSPITAL_COMMUNITY)
Admission: RE | Disposition: A | Discharge status: Home / Self Care | Payer: Self-pay | Source: Ambulatory Visit | Attending: Orthopedic Surgery

## 2021-09-23 DIAGNOSIS — M25761 Osteophyte, right knee: Secondary | ICD-10-CM | POA: Diagnosis present

## 2021-09-23 DIAGNOSIS — M1711 Unilateral primary osteoarthritis, right knee: Principal | ICD-10-CM | POA: Diagnosis present

## 2021-09-23 DIAGNOSIS — M179 Osteoarthritis of knee, unspecified: Secondary | ICD-10-CM

## 2021-09-23 DIAGNOSIS — F17210 Nicotine dependence, cigarettes, uncomplicated: Secondary | ICD-10-CM | POA: Diagnosis present

## 2021-09-23 DIAGNOSIS — F331 Major depressive disorder, recurrent, moderate: Secondary | ICD-10-CM | POA: Diagnosis present

## 2021-09-23 DIAGNOSIS — I2510 Atherosclerotic heart disease of native coronary artery without angina pectoris: Secondary | ICD-10-CM | POA: Diagnosis present

## 2021-09-23 DIAGNOSIS — Z888 Allergy status to other drugs, medicaments and biological substances status: Secondary | ICD-10-CM

## 2021-09-23 DIAGNOSIS — Z885 Allergy status to narcotic agent status: Secondary | ICD-10-CM

## 2021-09-23 DIAGNOSIS — R110 Nausea: Secondary | ICD-10-CM | POA: Diagnosis not present

## 2021-09-23 DIAGNOSIS — F411 Generalized anxiety disorder: Secondary | ICD-10-CM | POA: Diagnosis present

## 2021-09-23 DIAGNOSIS — I872 Venous insufficiency (chronic) (peripheral): Secondary | ICD-10-CM | POA: Diagnosis present

## 2021-09-23 DIAGNOSIS — Z86718 Personal history of other venous thrombosis and embolism: Secondary | ICD-10-CM

## 2021-09-23 DIAGNOSIS — K219 Gastro-esophageal reflux disease without esophagitis: Secondary | ICD-10-CM | POA: Diagnosis present

## 2021-09-23 DIAGNOSIS — I252 Old myocardial infarction: Secondary | ICD-10-CM

## 2021-09-23 DIAGNOSIS — G4700 Insomnia, unspecified: Secondary | ICD-10-CM | POA: Diagnosis present

## 2021-09-23 DIAGNOSIS — Z79899 Other long term (current) drug therapy: Secondary | ICD-10-CM

## 2021-09-23 HISTORY — PX: TOTAL KNEE ARTHROPLASTY: SHX125

## 2021-09-23 SURGERY — ARTHROPLASTY, KNEE, TOTAL
Anesthesia: Spinal | Site: Knee | Laterality: Right

## 2021-09-23 MED ORDER — OXYCODONE HCL 5 MG PO TABS: 5.0000 mg | ORAL_TABLET | Freq: Once | ORAL | Status: DC | PRN

## 2021-09-23 MED ORDER — ACETAMINOPHEN 500 MG PO TABS
1000.0000 mg | ORAL_TABLET | Freq: Four times a day (QID) | ORAL | Status: AC
  Administered 2021-09-23 – 2021-09-24 (×3): 1000 mg via ORAL
  Filled 2021-09-23 (×3): qty 2

## 2021-09-23 MED ORDER — PHENYLEPHRINE 40 MCG/ML (10ML) SYRINGE FOR IV PUSH (FOR BLOOD PRESSURE SUPPORT)
PREFILLED_SYRINGE | INTRAVENOUS | Status: DC | PRN
  Administered 2021-09-23: 80 ug via INTRAVENOUS

## 2021-09-23 MED ORDER — SODIUM CHLORIDE 0.9 % IV SOLN
INTRAVENOUS | Status: DC | PRN
  Administered 2021-09-23: 2000 mg via TOPICAL

## 2021-09-23 MED ORDER — MORPHINE SULFATE (PF) 2 MG/ML IV SOLN: 1.0000 mg | INTRAVENOUS | Status: DC | PRN

## 2021-09-23 MED ORDER — EPHEDRINE SULFATE-NACL 50-0.9 MG/10ML-% IV SOSY
PREFILLED_SYRINGE | INTRAVENOUS | Status: DC | PRN
  Administered 2021-09-23 (×2): 10 mg via INTRAVENOUS

## 2021-09-23 MED ORDER — ORAL CARE MOUTH RINSE: 15.0000 mL | Freq: Once | OROMUCOSAL | Status: AC

## 2021-09-23 MED ORDER — CHLORHEXIDINE GLUCONATE 0.12 % MT SOLN
15.0000 mL | Freq: Once | OROMUCOSAL | Status: AC
  Administered 2021-09-23: 15 mL via OROMUCOSAL

## 2021-09-23 MED ORDER — METHOCARBAMOL 500 MG PO TABS
500.0000 mg | ORAL_TABLET | Freq: Four times a day (QID) | ORAL | Status: DC | PRN
  Administered 2021-09-24: 500 mg via ORAL
  Filled 2021-09-23: qty 1

## 2021-09-23 MED ORDER — HYDROMORPHONE HCL 1 MG/ML IJ SOLN
INTRAMUSCULAR | Status: AC
  Administered 2021-09-23: 0.5 mg via INTRAVENOUS
  Filled 2021-09-23: qty 2

## 2021-09-23 MED ORDER — LACTATED RINGERS IV SOLN: INTRAVENOUS | Status: DC

## 2021-09-23 MED ORDER — RIVAROXABAN 10 MG PO TABS
10.0000 mg | ORAL_TABLET | Freq: Every day | ORAL | Status: DC
  Administered 2021-09-24 – 2021-09-25 (×2): 10 mg via ORAL
  Filled 2021-09-23 (×2): qty 1

## 2021-09-23 MED ORDER — HYDROMORPHONE HCL 2 MG PO TABS
2.0000 mg | ORAL_TABLET | ORAL | Status: DC | PRN
  Administered 2021-09-24 (×2): 2 mg via ORAL
  Administered 2021-09-25: 4 mg via ORAL
  Filled 2021-09-23: qty 2
  Filled 2021-09-23 (×2): qty 1

## 2021-09-23 MED ORDER — FAMOTIDINE 20 MG PO TABS: 20.0000 mg | ORAL_TABLET | Freq: Every day | ORAL | Status: DC | PRN

## 2021-09-23 MED ORDER — MENTHOL 3 MG MT LOZG: 1.0000 | LOZENGE | OROMUCOSAL | Status: DC | PRN

## 2021-09-23 MED ORDER — SODIUM CHLORIDE (PF) 0.9 % IJ SOLN
INTRAMUSCULAR | Status: DC | PRN
  Administered 2021-09-23: 60 mL

## 2021-09-23 MED ORDER — POLYETHYLENE GLYCOL 3350 17 G PO PACK: 17.0000 g | PACK | Freq: Every day | ORAL | Status: DC | PRN

## 2021-09-23 MED ORDER — CEFAZOLIN SODIUM-DEXTROSE 2-4 GM/100ML-% IV SOLN
2.0000 g | INTRAVENOUS | Status: AC
  Administered 2021-09-23: 2 g via INTRAVENOUS
  Filled 2021-09-23: qty 100

## 2021-09-23 MED ORDER — ONDANSETRON HCL 4 MG PO TABS: 4.0000 mg | ORAL_TABLET | Freq: Four times a day (QID) | ORAL | Status: DC | PRN

## 2021-09-23 MED ORDER — PROPRANOLOL HCL ER 60 MG PO CP24
60.0000 mg | ORAL_CAPSULE | Freq: Every day | ORAL | Status: DC
  Administered 2021-09-24 – 2021-09-25 (×2): 60 mg via ORAL
  Filled 2021-09-23 (×2): qty 1

## 2021-09-23 MED ORDER — BISACODYL 10 MG RE SUPP: 10.0000 mg | Freq: Every day | RECTAL | Status: DC | PRN

## 2021-09-23 MED ORDER — MIDAZOLAM HCL 2 MG/2ML IJ SOLN
1.0000 mg | Freq: Once | INTRAMUSCULAR | Status: AC
  Administered 2021-09-23: 2 mg via INTRAVENOUS
  Filled 2021-09-23: qty 2

## 2021-09-23 MED ORDER — SODIUM CHLORIDE (PF) 0.9 % IJ SOLN
INTRAMUSCULAR | Status: AC
  Filled 2021-09-23: qty 50

## 2021-09-23 MED ORDER — HYDROMORPHONE HCL 1 MG/ML IJ SOLN
0.2500 mg | INTRAMUSCULAR | Status: DC | PRN
  Administered 2021-09-23 (×3): 0.5 mg via INTRAVENOUS

## 2021-09-23 MED ORDER — PHENOL 1.4 % MT LIQD: 1.0000 | OROMUCOSAL | Status: DC | PRN

## 2021-09-23 MED ORDER — DEXAMETHASONE SODIUM PHOSPHATE 10 MG/ML IJ SOLN
INTRAMUSCULAR | Status: DC | PRN
Start: 2021-09-23 — End: 2021-09-23
  Administered 2021-09-23: 10 mg via INTRAVENOUS

## 2021-09-23 MED ORDER — FLEET ENEMA 7-19 GM/118ML RE ENEM: 1.0000 | ENEMA | Freq: Once | RECTAL | Status: DC | PRN

## 2021-09-23 MED ORDER — FENTANYL CITRATE PF 50 MCG/ML IJ SOSY
50.0000 ug | PREFILLED_SYRINGE | Freq: Once | INTRAMUSCULAR | Status: AC
  Administered 2021-09-23: 100 ug via INTRAVENOUS
  Filled 2021-09-23: qty 2

## 2021-09-23 MED ORDER — DEXAMETHASONE SODIUM PHOSPHATE 10 MG/ML IJ SOLN
10.0000 mg | Freq: Once | INTRAMUSCULAR | Status: AC
  Administered 2021-09-24: 10 mg via INTRAVENOUS
  Filled 2021-09-23: qty 1

## 2021-09-23 MED ORDER — OXYCODONE HCL 5 MG PO TABS
ORAL_TABLET | ORAL | Status: AC
  Filled 2021-09-23: qty 1

## 2021-09-23 MED ORDER — CEFAZOLIN SODIUM-DEXTROSE 2-4 GM/100ML-% IV SOLN
2.0000 g | Freq: Four times a day (QID) | INTRAVENOUS | Status: AC
  Administered 2021-09-23 – 2021-09-24 (×2): 2 g via INTRAVENOUS
  Filled 2021-09-23 (×2): qty 100

## 2021-09-23 MED ORDER — MIDAZOLAM HCL 2 MG/2ML IJ SOLN
INTRAMUSCULAR | Status: AC
  Administered 2021-09-23: 2 mg via INTRAVENOUS
  Filled 2021-09-23: qty 2

## 2021-09-23 MED ORDER — ROPIVACAINE HCL 7.5 MG/ML IJ SOLN
INTRAMUSCULAR | Status: DC | PRN
  Administered 2021-09-23: 20 mL via PERINEURAL

## 2021-09-23 MED ORDER — BUPIVACAINE LIPOSOME 1.3 % IJ SUSP
INTRAMUSCULAR | Status: DC | PRN
  Administered 2021-09-23: 20 mL

## 2021-09-23 MED ORDER — GABAPENTIN 300 MG PO CAPS
300.0000 mg | ORAL_CAPSULE | Freq: Three times a day (TID) | ORAL | Status: DC
  Administered 2021-09-23 – 2021-09-25 (×6): 300 mg via ORAL
  Filled 2021-09-23 (×6): qty 1

## 2021-09-23 MED ORDER — METOCLOPRAMIDE HCL 5 MG PO TABS: 5.0000 mg | ORAL_TABLET | Freq: Three times a day (TID) | ORAL | Status: DC | PRN

## 2021-09-23 MED ORDER — MECLIZINE HCL 25 MG PO TABS: 25.0000 mg | ORAL_TABLET | Freq: Three times a day (TID) | ORAL | Status: DC | PRN

## 2021-09-23 MED ORDER — ACETAMINOPHEN 10 MG/ML IV SOLN
1000.0000 mg | Freq: Four times a day (QID) | INTRAVENOUS | Status: DC
  Administered 2021-09-23: 1000 mg via INTRAVENOUS
  Filled 2021-09-23: qty 100

## 2021-09-23 MED ORDER — PROPOFOL 500 MG/50ML IV EMUL
INTRAVENOUS | Status: DC | PRN
  Administered 2021-09-23: 75 ug/kg/min via INTRAVENOUS

## 2021-09-23 MED ORDER — TRANEXAMIC ACID 1000 MG/10ML IV SOLN
2000.0000 mg | Freq: Once | INTRAVENOUS | Status: AC
  Filled 2021-09-23: qty 20

## 2021-09-23 MED ORDER — SODIUM CHLORIDE 0.9 % IV SOLN: INTRAVENOUS | Status: DC

## 2021-09-23 MED ORDER — ONDANSETRON HCL 4 MG/2ML IJ SOLN
INTRAMUSCULAR | Status: DC | PRN
  Administered 2021-09-23: 4 mg via INTRAVENOUS

## 2021-09-23 MED ORDER — DEXAMETHASONE SODIUM PHOSPHATE 10 MG/ML IJ SOLN: 8.0000 mg | Freq: Once | INTRAMUSCULAR | Status: AC

## 2021-09-23 MED ORDER — BUPIVACAINE IN DEXTROSE 0.75-8.25 % IT SOLN
INTRATHECAL | Status: DC | PRN
Start: 2021-09-23 — End: 2021-09-23
  Administered 2021-09-23: 1.8 mL via INTRATHECAL

## 2021-09-23 MED ORDER — METOCLOPRAMIDE HCL 5 MG/ML IJ SOLN
5.0000 mg | Freq: Three times a day (TID) | INTRAMUSCULAR | Status: DC | PRN
  Administered 2021-09-23: 10 mg via INTRAVENOUS
  Filled 2021-09-23: qty 2

## 2021-09-23 MED ORDER — METHOCARBAMOL 500 MG IVPB - SIMPLE MED
INTRAVENOUS | Status: AC
  Filled 2021-09-23: qty 50

## 2021-09-23 MED ORDER — BUPIVACAINE LIPOSOME 1.3 % IJ SUSP
INTRAMUSCULAR | Status: AC
  Filled 2021-09-23: qty 20

## 2021-09-23 MED ORDER — ONDANSETRON HCL 4 MG/2ML IJ SOLN
4.0000 mg | Freq: Four times a day (QID) | INTRAMUSCULAR | Status: DC | PRN
  Administered 2021-09-23: 4 mg via INTRAVENOUS
  Filled 2021-09-23: qty 2

## 2021-09-23 MED ORDER — STERILE WATER FOR IRRIGATION IR SOLN
Status: DC | PRN
  Administered 2021-09-23: 2000 mL

## 2021-09-23 MED ORDER — QUETIAPINE FUMARATE 50 MG PO TABS
100.0000 mg | ORAL_TABLET | Freq: Every day | ORAL | Status: DC
  Administered 2021-09-23 – 2021-09-24 (×2): 100 mg via ORAL
  Filled 2021-09-23 (×2): qty 2

## 2021-09-23 MED ORDER — OXYCODONE HCL 5 MG/5ML PO SOLN: 5.0000 mg | Freq: Once | ORAL | Status: DC | PRN

## 2021-09-23 MED ORDER — DOCUSATE SODIUM 100 MG PO CAPS
100.0000 mg | ORAL_CAPSULE | Freq: Two times a day (BID) | ORAL | Status: DC
  Administered 2021-09-23 – 2021-09-25 (×4): 100 mg via ORAL
  Filled 2021-09-23 (×4): qty 1

## 2021-09-23 MED ORDER — SODIUM CHLORIDE (PF) 0.9 % IJ SOLN
INTRAMUSCULAR | Status: AC
  Filled 2021-09-23: qty 10

## 2021-09-23 MED ORDER — CLONAZEPAM 0.5 MG PO TABS
0.5000 mg | ORAL_TABLET | Freq: Every day | ORAL | Status: DC | PRN
  Administered 2021-09-24: 0.5 mg via ORAL
  Filled 2021-09-23: qty 1

## 2021-09-23 MED ORDER — POVIDONE-IODINE 10 % EX SWAB
2.0000 "application " | Freq: Once | CUTANEOUS | Status: AC
  Administered 2021-09-23: 2 via TOPICAL

## 2021-09-23 MED ORDER — ONDANSETRON HCL 4 MG/2ML IJ SOLN: 4.0000 mg | Freq: Once | INTRAMUSCULAR | Status: DC | PRN

## 2021-09-23 MED ORDER — LIDOCAINE 2% (20 MG/ML) 5 ML SYRINGE
INTRAMUSCULAR | Status: DC | PRN
  Administered 2021-09-23: 50 mg via INTRAVENOUS

## 2021-09-23 MED ORDER — SODIUM CHLORIDE 0.9 % IR SOLN
Status: DC | PRN
  Administered 2021-09-23 (×2): 1000 mL

## 2021-09-23 MED ORDER — TRAMADOL HCL 50 MG PO TABS
50.0000 mg | ORAL_TABLET | Freq: Four times a day (QID) | ORAL | Status: DC | PRN
  Administered 2021-09-24 – 2021-09-25 (×3): 100 mg via ORAL
  Filled 2021-09-23 (×3): qty 2

## 2021-09-23 MED ORDER — DIPHENHYDRAMINE HCL 12.5 MG/5ML PO ELIX: 12.5000 mg | ORAL_SOLUTION | ORAL | Status: DC | PRN

## 2021-09-23 MED ORDER — METHOCARBAMOL 500 MG IVPB - SIMPLE MED
500.0000 mg | Freq: Four times a day (QID) | INTRAVENOUS | Status: DC | PRN
  Administered 2021-09-23: 500 mg via INTRAVENOUS
  Filled 2021-09-23: qty 50

## 2021-09-23 MED ORDER — BUPIVACAINE LIPOSOME 1.3 % IJ SUSP: 20.0000 mL | Freq: Once | INTRAMUSCULAR | Status: AC

## 2021-09-23 SURGICAL SUPPLY — 66 items
BAG COUNTER SPONGE SURGICOUNT (BAG) ×1 IMPLANT
BAG SPEC THK2 15X12 ZIP CLS (MISCELLANEOUS) ×1
BAG SPNG CNTER NS LX DISP (BAG) ×1
BAG ZIPLOCK 12X15 (MISCELLANEOUS) ×3 IMPLANT
BLADE SAG 18X100X1.27 (BLADE) ×3 IMPLANT
BLADE SAW SGTL 11.0X1.19X90.0M (BLADE) ×3 IMPLANT
BNDG ELASTIC 6X5.8 VLCR STR LF (GAUZE/BANDAGES/DRESSINGS) ×3 IMPLANT
BOWL SMART MIX CTS (DISPOSABLE) ×3 IMPLANT
BSPLAT TIB 5D E CMNT KN RT (Knees) ×1 IMPLANT
CEMENT HV SMART SET (Cement) ×6 IMPLANT
CLSR STERI-STRIP ANTIMIC 1/2X4 (GAUZE/BANDAGES/DRESSINGS) ×1 IMPLANT
COMP FEM CMT PS STD 9 RT (Joint) ×2 IMPLANT
COMPONENT FEM CMT PS STD 9 RT (Joint) IMPLANT
COVER SURGICAL LIGHT HANDLE (MISCELLANEOUS) ×3 IMPLANT
CUFF TOURN SGL QUICK 34 (TOURNIQUET CUFF) ×2
CUFF TRNQT CYL 34X4.125X (TOURNIQUET CUFF) ×2 IMPLANT
DRAPE INCISE IOBAN 66X45 STRL (DRAPES) ×3 IMPLANT
DRAPE U-SHAPE 47X51 STRL (DRAPES) ×3 IMPLANT
DRESSING AQUACEL AG SP 3.5X10 (GAUZE/BANDAGES/DRESSINGS) IMPLANT
DRSG AQUACEL AG ADV 3.5X10 (GAUZE/BANDAGES/DRESSINGS) ×3 IMPLANT
DRSG AQUACEL AG SP 3.5X10 (GAUZE/BANDAGES/DRESSINGS) ×2
DURAPREP 26ML APPLICATOR (WOUND CARE) ×3 IMPLANT
ELECT REM PT RETURN 15FT ADLT (MISCELLANEOUS) ×3 IMPLANT
GLOVE BIO SURGEON STRL SZ 6.5 (GLOVE) ×3 IMPLANT
GLOVE BIO SURGEON STRL SZ7 (GLOVE) ×6 IMPLANT
GLOVE BIO SURGEON STRL SZ8 (GLOVE) ×6 IMPLANT
GLOVE BIOGEL PI IND STRL 7.0 (GLOVE) ×2 IMPLANT
GLOVE BIOGEL PI IND STRL 8 (GLOVE) ×2 IMPLANT
GLOVE BIOGEL PI IND STRL 8.5 (GLOVE) ×2 IMPLANT
GLOVE BIOGEL PI INDICATOR 7.0 (GLOVE) ×4
GLOVE BIOGEL PI INDICATOR 8 (GLOVE) ×1
GLOVE BIOGEL PI INDICATOR 8.5 (GLOVE) ×1
GOWN STRL REUS W/ TWL LRG LVL3 (GOWN DISPOSABLE) ×2 IMPLANT
GOWN STRL REUS W/TWL LRG LVL3 (GOWN DISPOSABLE) ×8
HANDPIECE INTERPULSE COAX TIP (DISPOSABLE) ×2
HDLS TROCR DRIL PIN KNEE 75 (PIN) ×2
HOLDER FOLEY CATH W/STRAP (MISCELLANEOUS) ×1 IMPLANT
IMMOBILIZER KNEE 20 (SOFTGOODS) ×2
IMMOBILIZER KNEE 20 THIGH 36 (SOFTGOODS) ×2 IMPLANT
KIT TURNOVER KIT A (KITS) ×1 IMPLANT
MANIFOLD NEPTUNE II (INSTRUMENTS) ×3 IMPLANT
NS IRRIG 1000ML POUR BTL (IV SOLUTION) ×3 IMPLANT
PACK TOTAL KNEE CUSTOM (KITS) ×3 IMPLANT
PADDING CAST COTTON 6X4 STRL (CAST SUPPLIES) ×6 IMPLANT
PADDING CAST SYN 6 (CAST SUPPLIES) ×1
PADDING CAST SYNTHETIC 6X4 NS (CAST SUPPLIES) IMPLANT
PIN DRILL HDLS TROCAR 75 4PK (PIN) IMPLANT
PROTECTOR NERVE ULNAR (MISCELLANEOUS) ×3 IMPLANT
PSN ASF PS VE R 6-9 KNEE 10 (Orthopedic Implant) ×2 IMPLANT
SCREW FEMALE HEX FIX 25X2.5 (ORTHOPEDIC DISPOSABLE SUPPLIES) ×1 IMPLANT
SET HNDPC FAN SPRY TIP SCT (DISPOSABLE) ×2 IMPLANT
SPIKE FLUID TRANSFER (MISCELLANEOUS) ×3 IMPLANT
STEM POLY PAT PLY 35M KNEE (Knees) ×1 IMPLANT
STEM TIBIA 5 DEG SZ E R KNEE (Knees) IMPLANT
STRIP CLOSURE SKIN 1/2X4 (GAUZE/BANDAGES/DRESSINGS) ×6 IMPLANT
SURFACE ARTC PRSNA 6-9 KNEE 10 (Orthopedic Implant) IMPLANT
SUT MNCRL AB 4-0 PS2 18 (SUTURE) ×3 IMPLANT
SUT STRATAFIX 0 PDS 27 VIOLET (SUTURE) ×2
SUT VIC AB 2-0 CT1 27 (SUTURE) ×6
SUT VIC AB 2-0 CT1 TAPERPNT 27 (SUTURE) ×6 IMPLANT
SUTURE STRATFX 0 PDS 27 VIOLET (SUTURE) ×2 IMPLANT
TIBIA STEM 5 DEG SZ E R KNEE (Knees) ×2 IMPLANT
TRAY FOLEY MTR SLVR 16FR STAT (SET/KITS/TRAYS/PACK) ×3 IMPLANT
TUBE SUCTION HIGH CAP CLEAR NV (SUCTIONS) ×3 IMPLANT
WATER STERILE IRR 1000ML POUR (IV SOLUTION) ×6 IMPLANT
WRAP KNEE MAXI GEL POST OP (GAUZE/BANDAGES/DRESSINGS) ×3 IMPLANT

## 2021-09-23 NOTE — Progress Notes (Signed)
Assisted Dr. Foster with right, adductor canal, ultrasound guided block. Side rails up, monitors on throughout procedure. See vital signs in flow sheet. Tolerated Procedure well.  

## 2021-09-23 NOTE — Discharge Instructions (Addendum)
? ?Jillian Aluisio, MD ?Total Joint Specialist ?[facility] ?[address] ?[place], [place] [address] ?[phone] ? ?TOTAL KNEE REPLACEMENT POSTOPERATIVE DIRECTIONS ? ? ? ?Knee Rehabilitation, Guidelines Following Surgery  ?Results after knee surgery are often greatly improved when you follow the exercise, range of motion and muscle strengthening exercises prescribed by your doctor. Safety measures are also important to protect the knee from further injury. If any of these exercises cause you to have increased pain or swelling in your knee joint, decrease the amount until you are comfortable again and slowly increase them. If you have problems or questions, call your caregiver or physical therapist for advice.  ? ?BLOOD CLOT PREVENTION ?Take a 10 mg Xarelto once a day for three weeks following surgery. Then take an 81 mg Aspirin once a day for three weeks. Then discontinue Aspirin. ?You may resume your vitamins/supplements once you have discontinued the Xarelto. ?Do not take any NSAIDs (Advil, Aleve, Ibuprofen, Meloxicam, etc.) until you have discontinued the Xarelto.  ? ?HOME CARE INSTRUCTIONS  ?Remove items at home which could result in a fall. This includes throw rugs or furniture in walking pathways.  ?ICE to the affected knee as much as tolerated. Icing helps control swelling. If the swelling is well controlled you will be more comfortable and rehab easier. Continue to use ice on the knee for pain and swelling from surgery. You may notice swelling that will progress down to the foot and ankle. This is normal after surgery. Elevate the leg when you are not up walking on it.    ?Continue to use the breathing machine which will help keep your temperature down. It is common for your temperature to cycle up and down following surgery, especially at night when you are not up moving around and exerting yourself. The breathing machine keeps your lungs expanded and your temperature  down. ?Do not place pillow under the operative knee, focus on keeping the knee straight while resting ? ?DIET ?You may resume your previous home diet once you are discharged from the hospital. ? ?DRESSING / WOUND CARE / SHOWERING ?Keep your bulky bandage on for 2 days. On the third post-operative day you may remove the Ace bandage and gauze. There is a waterproof adhesive bandage on your skin which will stay in place until your first follow-up appointment. Once you remove this you will not need to place another bandage ?You may begin showering 3 days following surgery, but do not submerge the incision under water. ? ?ACTIVITY ?For the first 5 days, the key is rest and control of pain and swelling ?Do your home exercises twice a day starting on post-operative day 3. On the days you go to physical therapy, just do the home exercises once that day. ?You should rest, ice and elevate the leg for 50 minutes out of every hour. Get up and walk/stretch for 10 minutes per hour. After 5 days you can increase your activity slowly as tolerated. ?Walk with your walker as instructed. Use the walker until you are comfortable transitioning to a cane. Walk with the cane in the opposite hand of the operative leg. You may discontinue the cane once you are comfortable and walking steadily. ?Avoid periods of inactivity such as sitting longer than an hour when not asleep. This helps prevent blood clots.  ?You may discontinue the knee immobilizer once you are able to perform a straight leg raise while lying down. ?You may resume a sexual relationship in one month or   when given the OK by your doctor.  ?You may return to work once you are cleared by your doctor.  ?Do not drive a car for 6 weeks or until released by your surgeon.  ?Do not drive while taking narcotics. ? ?TED HOSE STOCKINGS ?Wear the elastic stockings on both legs for three weeks following surgery during the day. You may remove them at night for sleeping. ? ?WEIGHT  BEARING ?Weight bearing as tolerated with assist device (walker, cane, etc) as directed, use it as long as suggested by your surgeon or therapist, typically at least 4-6 weeks. ? ?POSTOPERATIVE CONSTIPATION PROTOCOL ?Constipation - defined medically as fewer than three stools per week and severe constipation as less than one stool per week. ? ?One of the most common issues patients have following surgery is constipation.  Even if you have a regular bowel pattern at home, your normal regimen is likely to be disrupted due to multiple reasons following surgery.  Combination of anesthesia, postoperative narcotics, change in appetite and fluid intake all can affect your bowels.  In order to avoid complications following surgery, here are some recommendations in order to help you during your recovery period. ? ?Colace (docusate) - Pick up an over-the-counter form of Colace or another stool softener and take twice a day as long as you are requiring postoperative pain medications.  Take with a full glass of water daily.  If you experience loose stools or diarrhea, hold the colace until you stool forms back up. If your symptoms do not get better within 1 week or if they get worse, check with your doctor. ?Dulcolax (bisacodyl) - Pick up over-the-counter and take as directed by the product packaging as needed to assist with the movement of your bowels.  Take with a full glass of water.  Use this product as needed if not relieved by Colace only.  ?MiraLax (polyethylene glycol) - Pick up over-the-counter to have on hand. MiraLax is a solution that will increase the amount of water in your bowels to assist with bowel movements.  Take as directed and can mix with a glass of water, juice, soda, coffee, or tea. Take if you go more than two days without a movement. Do not use MiraLax more than once per day. Call your doctor if you are still constipated or irregular after using this medication for 7 days in a row. ? ?If you continue  to have problems with postoperative constipation, please contact the office for further assistance and recommendations.  If you experience "the worst abdominal pain ever" or develop nausea or vomiting, please contact the office immediatly for further recommendations for treatment. ? ?ITCHING ?If you experience itching with your medications, try taking only a single pain pill, or even half a pain pill at a time.  You can also use Benadryl over the counter for itching or also to help with sleep.  ? ?MEDICATIONS ?See your medication summary on the ?After Visit Summary? that the nursing staff will review with you prior to discharge.  You may have some home medications which will be placed on hold until you complete the course of blood thinner medication.  It is important for you to complete the blood thinner medication as prescribed by your surgeon.  Continue your approved medications as instructed at time of discharge. ? ?PRECAUTIONS ?If you experience chest pain or shortness of breath - call 911 immediately for transfer to the hospital emergency department.  ?If you develop a fever greater that 101 F, purulent   drainage from wound, increased redness or drainage from wound, foul odor from the wound/dressing, or calf pain - CONTACT YOUR SURGEON.   ?                                                ?FOLLOW-UP APPOINTMENTS ?Make sure you keep all of your appointments after your operation with your surgeon and caregivers. You should call the office at the above phone number and make an appointment for approximately two weeks after the date of your surgery or on the date instructed by your surgeon outlined in the "After Visit Summary". ? ?RANGE OF MOTION AND STRENGTHENING EXERCISES  ?Rehabilitation of the knee is important following a knee injury or an operation. After just a few days of immobilization, the muscles of the thigh which control the knee become weakened and shrink (atrophy). Knee exercises are designed to build up  the tone and strength of the thigh muscles and to improve knee motion. Often times heat used for twenty to thirty minutes before working out will loosen up your tissues and help with improving the range of motion but do

## 2021-09-23 NOTE — Evaluation (Signed)
Physical Therapy Evaluation ?Patient Details ?Name: Jillian [name] ?MRN: [id number] ?DOB: 01/23/1951 ?Today's Date: 09/23/2021 ? ?History of Present Illness ? Pt is a 72yo female presenting s/p R-TKA on 09/23/21. PMH: OA, CAD, GERD, MI, anxiety, OA.  ?Clinical Impression ? Jillian [name] is a 71 y.o. female POD 0 s/p R-TKA. Patient reports independence with mobility at baseline. Patient is now limited by functional impairments (see PT problem list below) and requires min assist for bed mobility. Upon dangling at EOB, pt nauseated and dizzy, returned to supine positioning and pt reported cessation of symptoms, further mobility deferred.  and gait with RW. Patient instructed in exercise to facilitate ROM and circulation to manage edema. Feel pt will progress well once anesthesia has cleared their system and will benefit from continued skilled PT interventions to address impairments and progress towards PLOF. Acute PT will follow to progress mobility and stair training in preparation for safe discharge home. ?   ?   ? ?Recommendations for follow up therapy are one component of a multi-disciplinary discharge planning process, led by the attending physician.  Recommendations may be updated based on patient status, additional functional criteria and insurance authorization. ? ?Follow Up Recommendations Follow physician's recommendations for discharge plan and follow up therapies ? ?  ?Assistance Recommended at Discharge Set up Supervision/Assistance  ?Patient can return home with the following ? A little help with walking and/or transfers;A little help with bathing/dressing/bathroom;Assistance with cooking/housework;Help with stairs or ramp for entrance;Assist for transportation ? ?  ?Equipment Recommendations None recommended by PT (Pt has recommended DME)  ?Recommendations for Other Services ?    ?  ?Functional Status Assessment Patient has had a recent decline in their functional status and demonstrates the ability to  make significant improvements in function in a reasonable and predictable amount of time.  ? ?  ?Precautions / Restrictions Precautions ?Precautions: Fall ?Required Braces or Orthoses: Knee Immobilizer - Left ?Knee Immobilizer - Left: On when out of bed or walking ?Restrictions ?Weight Bearing Restrictions: No ?Other Position/Activity Restrictions: wbat  ? ?  ? ?Mobility ? Bed Mobility ?Overal bed mobility: Needs Assistance ?Bed Mobility: Supine to Sit ?  ?  ?Supine to sit: Min assist, HOB elevated ?  ?  ?General bed mobility comments: Pt min assist for BLE advancement to sit EOB with VCs for hand placement. Upon sitting EOB, pt nauseated and dizzy. Returned to supine positioning, further mobility deferred. ?  ? ?Transfers ?  ?  ?  ?  ?  ?  ?  ?  ?  ?General transfer comment: deferred ?  ? ?Ambulation/Gait ?  ?  ?  ?  ?  ?  ?  ?General Gait Details: deferred ? ?Stairs ?  ?  ?  ?  ?  ? ?Wheelchair Mobility ?  ? ?Modified Rankin (Stroke Patients Only) ?  ? ?  ? ?Balance Overall balance assessment: Needs assistance ?Sitting-balance support: Feet supported, Bilateral upper extremity supported ?Sitting balance-Leahy Scale: Poor ?  ?  ?  ?  ?  ?  ?  ?  ?  ?  ?  ?  ?  ?  ?  ?  ?   ? ? ? ?Pertinent Vitals/Pain Pain Assessment ?Pain Assessment: No/denies pain  ? ? ?Home Living Family/patient expects to be discharged to:: Private residence ?Living Arrangements: Alone ?Available Help at Discharge: Friend(s);Available 24 hours/day ?Type of Home: House ?Home Access: Stairs to enter ?Entrance Stairs-Rails: Right;Left;Can reach both ?Entrance Stairs-Number of Steps: 5 ?  ?  Home Layout: One level ?Home Equipment: Educational psychologist (2 wheels);Cane - single point;Shower seat ?   ?  ?Prior Function Prior Level of Function : Independent/Modified Independent ?  ?  ?  ?  ?  ?  ?Mobility Comments: SPC at all times ?ADLs Comments: IND ?  ? ? ?Hand Dominance  ? Dominant Hand: Right ? ?  ?Extremity/Trunk Assessment  ? Upper  Extremity Assessment ?Upper Extremity Assessment: Overall WFL for tasks assessed ?  ? ?Lower Extremity Assessment ?Lower Extremity Assessment: RLE deficits/detail;LLE deficits/detail ?RLE Deficits / Details: MMT: ank pf 4/5, df 3/5, pt unable to perform straight leg raise without UE assist or PT assist but is able to perform hip abd/add. ?RLE Sensation: WNL ?LLE Deficits / Details: MMT: ank pf 4/5, df 3/5 ?LLE Sensation: WNL ?  ? ?Cervical / Trunk Assessment ?Cervical / Trunk Assessment: Normal  ?Communication  ? Communication: No difficulties  ?Cognition Arousal/Alertness: Awake/alert ?Behavior During Therapy: [facility] for tasks assessed/performed ?Overall Cognitive Status: Within Functional Limits for tasks assessed ?  ?  ?  ?  ?  ?  ?  ?  ?  ?  ?  ?  ?  ?  ?  ?  ?  ?  ?  ? ?  ?General Comments   ? ?  ?Exercises Total Joint Exercises ?Ankle Circles/Pumps: AROM, 20 reps  ? ?Assessment/Plan  ?  ?PT Assessment Patient needs continued PT services  ?PT Problem List Decreased strength;Decreased range of motion;Decreased activity tolerance;Decreased balance;Decreased mobility;Decreased coordination;Decreased safety awareness;Decreased knowledge of use of DME;Pain;Decreased knowledge of precautions ? ?   ?  ?PT Treatment Interventions DME instruction;Gait training;Stair training;Functional mobility training;Therapeutic activities;Therapeutic exercise;Balance training;Neuromuscular re-education;Patient/family education   ? ?PT Goals (Current goals can be found in the Care Plan section)  ?Acute Rehab PT Goals ?Patient Stated Goal: Move without pan ?PT Goal Formulation: With patient ?Time For Goal Achievement: 09/30/21 ?Potential to Achieve Goals: Good ? ?  ?Frequency 7X/week ?  ? ? ?Co-evaluation   ?  ?  ?  ?  ? ? ?  ?AM-PAC PT "6 Clicks" Mobility  ?Outcome Measure Help needed turning from your back to your side while in a flat bed without using bedrails?: A Little ?Help needed moving from lying on your back to sitting on the  side of a flat bed without using bedrails?: A Little ?Help needed moving to and from a bed to a chair (including a wheelchair)?: A Little ?Help needed standing up from a chair using your arms (e.g., wheelchair or bedside chair)?: A Little ?Help needed to walk in hospital room?: A Little ?Help needed climbing 3-5 steps with a railing? : A Lot ?6 Click Score: 17 ? ?  ?End of Session   ?Activity Tolerance: Other (comment) (Nausea and dizziness) ?Patient left: in bed;with call bell/phone within reach;with bed alarm set;with family/visitor present ?Nurse Communication: Mobility status ?PT Visit Diagnosis: Pain;Difficulty in walking, not elsewhere classified (R26.2) ?Pain - Right/Left: Right ?Pain - part of body: Knee ?  ? ?Time: 0623-7628 ?PT Time Calculation (min) (ACUTE ONLY): 24 min ? ? ?Charges:   PT Evaluation ?$PT Eval Low Complexity: 1 Low ?PT Treatments ?$Therapeutic Activity: 8-22 mins ?  ?   ? ? ?Jamesetta Geralds, PT, DPT ?[facility] ?Office: [phone] ?Pager: [phone] ? ? ?Jamesetta Geralds ?09/23/2021, 4:40 PM ? ?

## 2021-09-23 NOTE — Interval H&P Note (Signed)
History and Physical Interval Note: ? ?09/23/2021 ?9:55 AM ? ?Jillian [name]  has presented today for surgery, with the diagnosis of right knee osteoarthritis.  The various methods of treatment have been discussed with the patient and family. After consideration of risks, benefits and other options for treatment, the patient has consented to  Procedure(s): ?TOTAL KNEE ARTHROPLASTY (Right) as a surgical intervention.  The patient's history has been reviewed, patient examined, no change in status, stable for surgery.  I have reviewed the patient's chart and labs.  Questions were answered to the patient's satisfaction.   ? ? ?Pilar Plate Jehad Bisono ? ? ?

## 2021-09-23 NOTE — Progress Notes (Signed)
Orthopedic Tech Progress Note ?Patient Details:  ?Jillian [name] ?18-Jul-1950 ?[id number] ? ?CPM Right Knee ?CPM Right Knee: On ?Right Knee Flexion (Degrees): 40 ?Right Knee Extension (Degrees): 10 ? ?Post Interventions ?Patient Tolerated: Well ?Instructions Provided: Care of device, Adjustment of device ? ?Saul Fordyce ?09/23/2021, 1:19 PM ? ?

## 2021-09-23 NOTE — Anesthesia Procedure Notes (Signed)
Anesthesia Regional Block: Adductor canal block  ? ?Pre-Anesthetic Checklist: , timeout performed,  Correct Patient, Correct Site, Correct Laterality,  Correct Procedure, Correct Position, site marked,  Risks and benefits discussed,  Surgical consent,  Pre-op evaluation,  At surgeon's request and post-op pain management ? ?Laterality: Right ? ?Prep: chloraprep     ?  ?Needles:  ?Injection technique: Single-shot ? ?Needle Type: Echogenic Stimulator Needle   ? ? ?Needle Length: 10cm  ?Needle Gauge: 21  ? ?Needle insertion depth: 8 cm ? ? ?Additional Needles: ? ? ?Procedures:,,,, ultrasound used (permanent image in chart),,    ?Narrative:  ?Start time: 09/23/2021 11:13 AM ?End time: 09/23/2021 11:18 AM ?Injection made incrementally with aspirations every 5 mL. ? ?Performed by: Personally  ?Anesthesiologist: Mal Amabile, MD ? ?Additional Notes: ?Timeout performed. Patient sedated. Relevant anatomy ID'd using [place]. Incremental 2-30ml injection of LA with frequent aspiration. Patient tolerated procedure well. ? ? ? ?Right Adductor Canal Block ? ?

## 2021-09-23 NOTE — Anesthesia Postprocedure Evaluation (Signed)
Anesthesia Post Note ? ?Patient: Jillian [name] ? ?Procedure(s) Performed: TOTAL KNEE ARTHROPLASTY (Right: Knee) ? ?  ? ?Patient location during evaluation: PACU ?Anesthesia Type: Spinal ?Level of consciousness: oriented and awake and alert ?Pain management: pain level controlled ?Vital Signs Assessment: post-procedure vital signs reviewed and stable ?Respiratory status: spontaneous breathing, respiratory function stable and nonlabored ventilation ?Cardiovascular status: blood pressure returned to baseline and stable ?Postop Assessment: no headache, no backache, no apparent nausea or vomiting, spinal receding and patient able to bend at knees ?Anesthetic complications: no ? ? ?No notable events documented. ? ?Last Vitals:  ?Vitals:  ? 09/23/21 1359 09/23/21 1400  ?BP:  109/70  ?Pulse: 61 64  ?Resp: 12 13  ?Temp:  (!) 36.3 ?C  ?SpO2: 98% 100%  ?  ?Last Pain:  ?Vitals:  ? 09/23/21 1350  ?TempSrc:   ?PainSc: 3   ? ? ?  ?  ?  ?  ?  ?  ? ?Caryssa Elzey A. ? ? ? ? ?

## 2021-09-23 NOTE — Transfer of Care (Signed)
Immediate Anesthesia Transfer of Care Note ? ?Patient: Jillian [name] ? ?Procedure(s) Performed: Procedure(s): ?TOTAL KNEE ARTHROPLASTY (Right) ? ?Patient Location: PACU ? ?Anesthesia Type:Spinal ? ?Level of Consciousness: awake, alert  and oriented ? ?Airway & Oxygen Therapy: Patient Spontanous Breathing ? ?Post-op Assessment: Report given to RN and Post -op Vital signs reviewed and stable ? ?Post vital signs: Reviewed and stable ? ?Last Vitals:  ?Vitals:  ? 09/23/21 1115 09/23/21 1120  ?BP: 121/66 90/62  ?Pulse: 60 62  ?Resp: 14 12  ?Temp:    ?SpO2: 94% 94%  ? ? ?Complications: No apparent anesthesia complications ? ?

## 2021-09-23 NOTE — Op Note (Signed)
OPERATIVE REPORT-TOTAL KNEE ARTHROPLASTY ? ? ?Pre-operative diagnosis- Osteoarthritis  Right knee(s) ? ?Post-operative diagnosis- Osteoarthritis Right knee(s) ? ?Procedure-  Right  Total Knee Arthroplasty (Zimmer titanium Persona knee)  ? ?Surgeon- Dione Plover. Ericah Scotto, MD ? ?Assistant- Jaynie Bream, PA-C  ? ?Anesthesia-   Adductor canal block and spinal ? ?EBL-80 mL ?  ?Drains None ? ?Tourniquet time-  ?Total Tourniquet Time Documented: ?Thigh (Right) - 45 minutes ?Total: Thigh (Right) - 45 minutes ?   ? ?Complications- None ? ?Condition-PACU - hemodynamically stable.  ? ?Brief Clinical Note  Jillian [name] is a 71 y.o. year old female with end stage OA of her right knee with progressively worsening pain and dysfunction. She has constant pain, with activity and at rest and significant functional deficits with difficulties even with ADLs. She has had extensive non-op management including analgesics, injections of cortisone, and home exercise program, but remains in significant pain with significant dysfunction.Radiographs show bone on bone arthritis medial and patellofemoral. She presents now for right Total Knee Arthroplasty.    ? ?Procedure in detail--- ? ? The patient is brought into the operating room and positioned supine on the operating table. After successful administration of  Adductor canal block and spinal,   a tourniquet is placed high on the  Right thigh(s) and the lower extremity is prepped and draped in the usual sterile fashion. Time out is performed by the operating team and then the  Right lower extremity is wrapped in Esmarch, knee flexed and the tourniquet inflated to 300 mmHg.  ?     A midline incision is made with a ten blade through the subcutaneous tissue to the level of the extensor mechanism. A fresh blade is used to make a medial parapatellar arthrotomy. Soft tissue over the proximal medial tibia is subperiosteally elevated to the joint line with a knife and into the semimembranosus  bursa with a Cobb elevator. Soft tissue over the proximal lateral tibia is elevated with attention being paid to avoiding the patellar tendon on the tibial tubercle. The patella is everted, knee flexed 90 degrees and the ACL and PCL are removed. Findings are bone on bone medial and patellofemoral with large global osteophytes.   ?     The drill is used to create a starting hole in the distal femur and the canal is thoroughly irrigated with sterile saline to remove the fatty contents. The 5 degree Right  valgus alignment guide is placed into the femoral canal and the distal femoral cutting block is pinned to remove 10 mm off the distal femur. Resection is made with an oscillating saw. ?     The tibia is subluxed forward and the menisci are removed. The extramedullary alignment guide is placed referencing proximally at the medial aspect of the tibial tubercle and distally along the second metatarsal axis and tibial crest. The block is pinned to remove 45mm off the more deficient medial  side. Resection is made with an oscillating saw. Size E is the most appropriate size for the tibia and the proximal tibia is prepared with the modular drill and keel punch for that size. ?     The femoral sizing guide is placed and size 9 is most appropriate. Rotation is marked off the epicondylar axis and confirmed by creating a rectangular flexion gap at 90 degrees. The size 9 cutting block is pinned in this rotation and the anterior, posterior and chamfer cuts are made with the oscillating saw. The intercondylar block is then placed and  that cut is made. ?     Trial size E tibial component, trial size 9 posterior stabilized femur and a 10  mm posterior stabilized fixed bearing insert trial is placed. Full extension is achieved with excellent varus/valgus and anterior/posterior balance throughout full range of motion. The patella is everted and thickness measured to be 22  mm. Free hand resection is taken to 12 mm, a 35 template is  placed, lug holes are drilled, trial patella is placed, and it tracks normally. Osteophytes are removed off the posterior femur with the trial in place. All trials are removed and the cut bone surfaces prepared with pulsatile lavage. Cement is mixed and once ready for implantation, the size E tibial implant, size  9 posterior stabilized femoral component, and the size 35 patella are cemented in place and the patella is held with the clamp. The trial insert is placed and the knee held in full extension. The Exparel (20 ml mixed with 60 ml saline) is injected into the extensor mechanism, posterior capsule, medial and lateral gutters and subcutaneous tissues.  All extruded cement is removed and once the cement is hard the permanent 10 mm posterior stabilized fixed bearing insert is placed into the tibial tray. ?     The wound is copiously irrigated with saline solution and the extensor mechanism closed with # 0 Stratofix suture. The tourniquet is released for a total tourniquet time of 45  minutes. Flexion against gravity is 140 degrees and the patella tracks normally. Subcutaneous tissue is closed with 2.0 vicryl and subcuticular with running 4.0 Monocryl. The incision is cleaned and dried and steri-strips and a bulky sterile dressing are applied. The limb is placed into a knee immobilizer and the patient is awakened and transported to recovery in stable condition. ?     Please note that a surgical assistant was a medical necessity for this procedure in order to perform it in a safe and expeditious manner. Surgical assistant was necessary to retract the ligaments and vital neurovascular structures to prevent injury to them and also necessary for proper positioning of the limb to allow for anatomic placement of the prosthesis. ? ? ?Dione Plover Jillian Ayars, MD ? ? ? ?09/23/2021, 12:35 PM ? ? ?

## 2021-09-24 ENCOUNTER — Encounter (HOSPITAL_COMMUNITY): Payer: Self-pay | Admitting: Orthopedic Surgery

## 2021-09-24 DIAGNOSIS — Z79899 Other long term (current) drug therapy: Secondary | ICD-10-CM | POA: Diagnosis not present

## 2021-09-24 DIAGNOSIS — M1711 Unilateral primary osteoarthritis, right knee: Secondary | ICD-10-CM | POA: Diagnosis present

## 2021-09-24 DIAGNOSIS — G4700 Insomnia, unspecified: Secondary | ICD-10-CM | POA: Diagnosis present

## 2021-09-24 DIAGNOSIS — K219 Gastro-esophageal reflux disease without esophagitis: Secondary | ICD-10-CM | POA: Diagnosis present

## 2021-09-24 DIAGNOSIS — M25761 Osteophyte, right knee: Secondary | ICD-10-CM | POA: Diagnosis present

## 2021-09-24 DIAGNOSIS — Z86718 Personal history of other venous thrombosis and embolism: Secondary | ICD-10-CM | POA: Diagnosis not present

## 2021-09-24 DIAGNOSIS — I2510 Atherosclerotic heart disease of native coronary artery without angina pectoris: Secondary | ICD-10-CM | POA: Diagnosis present

## 2021-09-24 DIAGNOSIS — Z888 Allergy status to other drugs, medicaments and biological substances status: Secondary | ICD-10-CM | POA: Diagnosis not present

## 2021-09-24 DIAGNOSIS — F17210 Nicotine dependence, cigarettes, uncomplicated: Secondary | ICD-10-CM | POA: Diagnosis present

## 2021-09-24 DIAGNOSIS — F411 Generalized anxiety disorder: Secondary | ICD-10-CM | POA: Diagnosis present

## 2021-09-24 DIAGNOSIS — R110 Nausea: Secondary | ICD-10-CM | POA: Diagnosis not present

## 2021-09-24 DIAGNOSIS — Z885 Allergy status to narcotic agent status: Secondary | ICD-10-CM | POA: Diagnosis not present

## 2021-09-24 DIAGNOSIS — I872 Venous insufficiency (chronic) (peripheral): Secondary | ICD-10-CM | POA: Diagnosis present

## 2021-09-24 DIAGNOSIS — F331 Major depressive disorder, recurrent, moderate: Secondary | ICD-10-CM | POA: Diagnosis present

## 2021-09-24 DIAGNOSIS — I252 Old myocardial infarction: Secondary | ICD-10-CM | POA: Diagnosis not present

## 2021-09-24 LAB — CBC
HCT: 40.6 % (ref 36.0–46.0)
Hemoglobin: 13.2 g/dL (ref 12.0–15.0)
MCH: 28.5 pg (ref 26.0–34.0)
MCHC: 32.5 g/dL (ref 30.0–36.0)
MCV: 87.7 fL (ref 80.0–100.0)
Platelets: 170 10*3/uL (ref 150–400)
RBC: 4.63 MIL/uL (ref 3.87–5.11)
RDW: 14.2 % (ref 11.5–15.5)
WBC: 14 10*3/uL — ABNORMAL HIGH (ref 4.0–10.5)
nRBC: 0 % (ref 0.0–0.2)

## 2021-09-24 LAB — BASIC METABOLIC PANEL
Anion gap: 6 (ref 5–15)
BUN: 9 mg/dL (ref 8–23)
CO2: 26 mmol/L (ref 22–32)
Calcium: 8.9 mg/dL (ref 8.9–10.3)
Chloride: 102 mmol/L (ref 98–111)
Creatinine, Ser: 0.64 mg/dL (ref 0.44–1.00)
GFR, Estimated: >60 mL/min (ref >60)
Glucose, Bld: 150 mg/dL — ABNORMAL HIGH (ref 70–99)
Potassium: 4.6 mmol/L (ref 3.5–5.1)
Sodium: 134 mmol/L — ABNORMAL LOW (ref 135–145)

## 2021-09-24 MED ORDER — SODIUM CHLORIDE 0.9 % IV BOLUS
500.0000 mL | Freq: Once | INTRAVENOUS | Status: AC
  Administered 2021-09-24: 500 mL via INTRAVENOUS

## 2021-09-24 NOTE — Progress Notes (Signed)
Physical Therapy Treatment ?Patient Details ?Name: Jillian [name] ?MRN: [id number] ?DOB: 03-19-1951 ?Today's Date: 09/24/2021 ? ? ?History of Present Illness Pt is a 71yo female presenting s/p R-TKA on 09/23/21. PMH: OA, CAD, GERD, MI, anxiety, OA. ? ?  ?PT Comments  ? ? POD # 1 pm session ?General Comments: AxO x 3 very pleasant and motivated.  Slight anxiety.  Talk her through everything. Was still sitting in recliner.  General transfer comment: this session, did not use KI.  25% VC's on proper hand placement to rise from recliner to amb in hallway.  Also assisted with a toilet transfer with 75% VC's to complete turn prior to reaching outside her BOS for grab bar.  She did it again when getting to recliner.  Urgency to sit due to anxiety.  Redirected for safety.General Gait Details: tolerated an increased diatnce but still shaky/unsteady esp with her turns.  A little nervous but improving. Also assisted with amb to and from bathroom. Then returned to room to perform some TE's following HEP handout.  Instructed on proper tech, freq as well as use of ICE.   ?Pt has NOT met her mobility goals to safely D/C to home today. Will see tomorrow and add stair training to her session. ?  ?Recommendations for follow up therapy are one component of a multi-disciplinary discharge planning process, led by the attending physician.  Recommendations may be updated based on patient status, additional functional criteria and insurance authorization. ? ?Follow Up Recommendations ? Follow physician's recommendations for discharge plan and follow up therapies ?  ?  ?Assistance Recommended at Discharge Set up Supervision/Assistance  ?Patient can return home with the following A little help with walking and/or transfers;A little help with bathing/dressing/bathroom;Assistance with cooking/housework;Help with stairs or ramp for entrance;Assist for transportation ?  ?Equipment Recommendations ? None recommended by PT  ?  ?Recommendations for  Other Services   ? ? ?  ?Precautions / Restrictions Precautions ?Precautions: Fall ?Precaution Comments: instructed no pillow under knee ?Required Braces or Orthoses: Knee Immobilizer - Left ?Knee Immobilizer - Left: On when out of bed or walking ?Restrictions ?Weight Bearing Restrictions: No ?Other Position/Activity Restrictions: wbat  ?  ? ?Mobility ? Bed Mobility ?  ?General bed mobility comments: OOB in recliner ?  ? ?Transfers ?Overall transfer level: Needs assistance ?Equipment used: Rolling walker (2 wheels) ?Transfers: Sit to/from Stand ?Sit to Stand: Min assist ?  ?  ?  ?  ?  ?General transfer comment: this session, did not use KI.  25% VC's on proper hand placement to rise from recliner to amb in hallway.  Also assisted with a toilet transfer with 75% VC's to complete turn prior to reaching outside her BOS for grab bar.  She did it again when getting to recliner.  Urgency to sit due to anxiety.  Redirected for safety. ?  ? ?Ambulation/Gait ?Ambulation/Gait assistance: Min assist ?Gait Distance (Feet): 55 Feet ?Assistive device: Rolling walker (2 wheels) ?Gait Pattern/deviations: Step-to pattern, Decreased step length - left, Decreased step length - right, Staggering left, Staggering right, Narrow base of support ?Gait velocity: decreased ?  ?  ?General Gait Details: tolerated an increased diatnce but still shaky/unsteady esp with her turns.  A little nervous but improving. Also assisted with amb to and from bathroom. ? ? ?Stairs ?  ?  ?  ?  ?  ? ? ?Wheelchair Mobility ?  ? ?Modified Rankin (Stroke Patients Only) ?  ? ? ?  ?Balance   ?  ?  ?  ?  ?  ?  ?  ?  ?  ?  ?  ?  ?  ?  ?  ?  ?  ?  ?  ? ?  ?  Cognition Arousal/Alertness: Awake/alert ?Behavior During Therapy: [facility] for tasks assessed/performed ?Overall Cognitive Status: Within Functional Limits for tasks assessed ?  ?  ?  ?  ?  ?  ?  ?  ?  ?  ?  ?  ?  ?  ?  ?  ?General Comments: AxO x 3 very pleasant and motivated.  Slight anxiety.  Talk her through  everything. ?  ?  ? ?  ?Exercises  05 reps seated TE's following HEP handout followed by ICE ? ?  ?General Comments   ?  ?  ? ?Pertinent Vitals/Pain Pain Assessment ?Pain Assessment: 0-10 ?Pain Score: 4  ?Pain Location: R knee ?Pain Descriptors / Indicators: Discomfort, Tender, Operative site guarding ?Pain Intervention(s): Monitored during session, Premedicated before session, Repositioned, Ice applied  ? ? ?Home Living   ?  ?  ?  ?  ?  ?  ?  ?  ?  ?   ?  ?Prior Function    ?  ?  ?   ? ?PT Goals (current goals can now be found in the care plan section) Progress towards PT goals: Progressing toward goals ? ?  ?Frequency ? ? ? 7X/week ? ? ? ?  ?PT Plan Current plan remains appropriate  ? ? ?Co-evaluation   ?  ?  ?  ?  ? ?  ?AM-PAC PT "6 Clicks" Mobility   ?Outcome Measure ? Help needed turning from your back to your side while in a flat bed without using bedrails?: A Little ?Help needed moving from lying on your back to sitting on the side of a flat bed without using bedrails?: A Little ?Help needed moving to and from a bed to a chair (including a wheelchair)?: A Little ?Help needed standing up from a chair using your arms (e.g., wheelchair or bedside chair)?: A Little ?Help needed to walk in hospital room?: A Little ?Help needed climbing 3-5 steps with a railing? : A Lot ?6 Click Score: 17 ? ?  ?End of Session Equipment Utilized During Treatment: Gait belt ?Activity Tolerance: Patient tolerated treatment well ?Patient left: in chair;with call bell/phone within reach;with chair alarm set ?Nurse Communication: Mobility status ?PT Visit Diagnosis: Pain;Difficulty in walking, not elsewhere classified (R26.2) ?Pain - Right/Left: Right ?Pain - part of body: Knee ?  ? ? ?Time: 1440-1505 ?PT Time Calculation (min) (ACUTE ONLY): 25 min ? ?Charges:  $Gait Training: 8-22 mins ?$Therapeutic Exercise: 8-22 mins          ?          ? ?{Merrisa Skorupski  PTA ?Acute  Rehabilitation Services ?Pager      [phone] ?Office       [phone] ? ? ?

## 2021-09-24 NOTE — Progress Notes (Signed)
? ?  Subjective: ?1 Day Post-Op Procedure(s) (LRB): ?TOTAL KNEE ARTHROPLASTY (Right) ?Patient seen in rounds by Dr. Lequita Halt. ?Patient is well. Foley cath to be removed this AM. ?Patient reports pain as mild. She was unable to work with physical therapy yesterday due to nausea and dizziness. Symptoms have since subsided although she was noted to have soft blood pressure. Denies SOB or chest pain. ?We will start physical therapy today. ? ?Objective: ?Vital signs in last 24 hours: ?Temp:  [96.4 ?F (35.8 ?C)-98.6 ?F (37 ?C)] 97.9 ?F (36.6 ?C) (04/11 0600) ?Pulse Rate:  [58-80] 64 (04/11 0600) ?Resp:  [12-18] 17 (04/11 0600) ?BP: (90-135)/(58-80) 93/58 (04/11 0600) ?SpO2:  [93 %-100 %] 94 % (04/11 0600) ?Weight:  [73.9 kg] 73.9 kg (04/10 0923) ? ?Intake/Output from previous day: ? ?Intake/Output Summary (Last 24 hours) at 09/24/2021 0806 ?Last data filed at 09/24/2021 0606 ?Gross per 24 hour  ?Intake 2948.79 ml  ?Output 2330 ml  ?Net 618.79 ml  ?  ? ?Intake/Output this shift: ?No intake/output data recorded. ? ?Labs: ?Recent Labs  ?  09/24/21 ?0324  ?HGB 13.2  ? ?Recent Labs  ?  09/24/21 ?0324  ?WBC 14.0*  ?RBC 4.63  ?HCT 40.6  ?PLT 170  ? ?Recent Labs  ?  09/24/21 ?0324  ?NA 134*  ?K 4.6  ?CL 102  ?CO2 26  ?BUN 9  ?CREATININE 0.64  ?GLUCOSE 150*  ?CALCIUM 8.9  ? ?No results for input(s): LABPT, INR in the last 72 hours. ? ?Exam: ?General - Patient is Alert and Oriented ?Extremity - Neurologically intact ?Neurovascular intact ?Sensation intact distally ?Dorsiflexion/Plantar flexion intact ?Dressing - dressing C/D/I ?Motor Function - intact, moving foot and toes well on exam. ? ?Past Medical History:  ?Diagnosis Date  ? Adjustment disorder, unspecified   ? Arthritis   ? Coronary artery disease   ? Edema leg   ? GAD (generalized anxiety disorder)   ? GERD (gastroesophageal reflux disease)   ? Myocardial infarction [facility]) 1995  ? saw dr Tresa Endo   ? PONV (postoperative nausea and vomiting)   ? Tremor, unspecified   ? Unspecified  mood (affective) disorder (HCC)   ? 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 ? ?Assessment/Plan: ?1 Day Post-Op Procedure(s) (LRB): ?TOTAL KNEE ARTHROPLASTY (Right) ?Principal Problem: ?  OA (osteoarthritis) of knee ?Active Problems: ?  Osteoarthritis of right knee ? ?Estimated body mass index is 28.87 kg/m? as calculated from the following: ?  Height as of this encounter: 5\' 3"  (1.6 m). ?  Weight as of this encounter: 73.9 kg. ?Advance diet ?Up with therapy ?D/C IV fluids ? ? ?Patient's anticipated LOS is less than 2 midnights, meeting these requirements: ?- Lives within 1 hour of care ?- Has a competent adult at home to recover with post-op ?- NO history of ? - Chronic pain requiring opiods ? - Diabetes ? - Coronary Artery Disease ? - Heart failure ? - Heart attack ? - Stroke ? - DVT/VTE ? - Cardiac arrhythmia ? - Respiratory Failure/COPD ? - Renal failure ? - Anemia ? - Advanced Liver disease ? ?DVT Prophylaxis - Xarelto ?Weight bearing as tolerated. ?Start physical therapy. ? ?Plan is to go Home after hospital stay. Patient with start to work with physical therapy today. ?Patient given 500 mL bolus for soft blood pressure. Will continue to monitor. ? ?R. , PA-C ?Orthopedic Surgery ?[phone] ?09/24/2021, 8:06 AM ? ?

## 2021-09-24 NOTE — Progress Notes (Signed)
Physical Therapy Treatment ?Patient Details ?Name: Jillian [name] ?MRN: [id number] ?DOB: [birth year] ?Today's Date: 09/24/2021 ? ? ?History of Present Illness Pt is a 71yo female presenting s/p R-TKA on 09/23/21. PMH: OA, CAD, GERD, MI, anxiety, OA. ? ?  ?PT Comments  ? ? POD # 1 am session ?General Comments: AxO x 3 very pleasant and motivated.  Slight anxiety.  Talk her through everything. General bed mobility comments: Pt still wearing KI so kept on this sesssion.  Instructed pt on use.  Demonstarted and instructed how to use a belt to self assist LE off bed. General transfer comment: 50% VC's on proper hand placement to push off bed vs pull on walker. General Gait Details: unsteady/shaky gait with mild fear of falling/anxiety.  50% VC's on proper sequencing and proper walker to self distance.  A little nervous but she did fine.  No c/o dizziness/nausea.  Recliner following as a precaution. Would NOT rec she amb on her own just yet. Then returned to room to perform some TE's following HEP handout.  Instructed on proper tech, freq as well as use of ICE.   ?Pt will be seen again this afternoon.   ?Recommendations for follow up therapy are one component of a multi-disciplinary discharge planning process, led by the attending physician.  Recommendations may be updated based on patient status, additional functional criteria and insurance authorization. ? ?Follow Up Recommendations ? Follow physician's recommendations for discharge plan and follow up therapies ?  ?  ?Assistance Recommended at Discharge Set up Supervision/Assistance  ?Patient can return home with the following A little help with walking and/or transfers;A little help with bathing/dressing/bathroom;Assistance with cooking/housework;Help with stairs or ramp for entrance;Assist for transportation ?  ?Equipment Recommendations ? None recommended by PT  ?  ?Recommendations for Other Services   ? ? ?  ?Precautions / Restrictions Precautions ?Precautions:  Fall ?Precaution Comments: instructed no pillow under knee ?Required Braces or Orthoses: Knee Immobilizer - Left ?Knee Immobilizer - Left: On when out of bed or walking ?Restrictions ?Weight Bearing Restrictions: No ?Other Position/Activity Restrictions: wbat  ?  ? ?Mobility ? Bed Mobility ?Overal bed mobility: Needs Assistance ?Bed Mobility: Supine to Sit ?  ?  ?Supine to sit: Supervision, Min guard ?  ?  ?General bed mobility comments: Pt still wearing KI so kept on this sesssion.  Instructed pt on use.  Demonstarted and instructed how to use a beklt to self assist LE off bed. ?  ? ?Transfers ?Overall transfer level: Needs assistance ?Equipment used: Rolling walker (2 wheels) ?Transfers: Sit to/from Stand ?Sit to Stand: Min assist ?  ?  ?  ?  ?  ?General transfer comment: 50% VC's on proper hand placement to push off bed vs pull on walker. ?  ? ?Ambulation/Gait ?Ambulation/Gait assistance: Min assist, Mod assist ?Gait Distance (Feet): 42 Feet ?Assistive device: Rolling walker (2 wheels) ?Gait Pattern/deviations: Step-to pattern, Decreased step length - left, Decreased step length - right, Staggering left, Staggering right, Narrow base of support ?Gait velocity: decreased ?  ?  ?General Gait Details: unsteady/shaky gait with mild fear of falling/anxiety.  50% VC's on proper sequencing and proper walker to self distance.  A little nervous but she did fine.  No c/o dizziness/nausea.  Recliner following as a precaution. Would NOT rec she amb on her own just yet. ? ? ?Stairs ?  ?  ?  ?  ?  ? ? ?Wheelchair Mobility ?  ? ?Modified Rankin (Stroke Patients Only) ?  ? ? ?  ?  Balance   ?  ?  ?  ?  ?  ?  ?  ?  ?  ?  ?  ?  ?  ?  ?  ?  ?  ?  ?  ? ?  ?Cognition Arousal/Alertness: Awake/alert ?Behavior During Therapy: [facility] for tasks assessed/performed ?Overall Cognitive Status: Within Functional Limits for tasks assessed ?  ?  ?  ?  ?  ?  ?  ?  ?  ?  ?  ?  ?  ?  ?  ?  ?General Comments: AxO x 3 very pleasant and motivated.  Slight  anxiety.  Talk her through everything. ?  ?  ? ?  ?Exercises  Total Knee Replacement TE's following HEP handout ?10 reps B LE ankle pumps ?05 reps towel squeezes ?05 reps knee presses ?05 reps heel slides  ?05 reps SAQ's ?05 reps SLR's ?05 reps ABD ?Educated on use of gait belt to assist with TE's ?Followed by ICE ? ? ?  ?General Comments   ?  ?  ? ?Pertinent Vitals/Pain Pain Assessment ?Pain Assessment: 0-10 ?Pain Score: 4  ?Pain Location: R knee ?Pain Descriptors / Indicators: Discomfort, Tender, Operative site guarding ?Pain Intervention(s): Monitored during session, Premedicated before session, Repositioned, Ice applied  ? ? ?Home Living   ?  ?  ?  ?  ?  ?  ?  ?  ?  ?   ?  ?Prior Function    ?  ?  ?   ? ?PT Goals (current goals can now be found in the care plan section) Progress towards PT goals: Progressing toward goals ? ?  ?Frequency ? ? ? 7X/week ? ? ? ?  ?PT Plan Current plan remains appropriate  ? ? ?Co-evaluation   ?  ?  ?  ?  ? ?  ?AM-PAC PT "6 Clicks" Mobility   ?Outcome Measure ? Help needed turning from your back to your side while in a flat bed without using bedrails?: A Little ?Help needed moving from lying on your back to sitting on the side of a flat bed without using bedrails?: A Little ?Help needed moving to and from a bed to a chair (including a wheelchair)?: A Little ?Help needed standing up from a chair using your arms (e.g., wheelchair or bedside chair)?: A Little ?Help needed to walk in hospital room?: A Little ?Help needed climbing 3-5 steps with a railing? : A Lot ?6 Click Score: 17 ? ?  ?End of Session Equipment Utilized During Treatment: Gait belt ?Activity Tolerance: Patient tolerated treatment well ?Patient left: in chair;with call bell/phone within reach;with chair alarm set ?Nurse Communication: Mobility status ?PT Visit Diagnosis: Pain;Difficulty in walking, not elsewhere classified (R26.2) ?Pain - Right/Left: Right ?Pain - part of body: Knee ?  ? ? ?Time: 1115-1140 ?PT Time  Calculation (min) (ACUTE ONLY): 25 min ? ?Charges:  $Gait Training: 8-22 mins ?$Therapeutic Exercise: 8-22 mins          ?          ? ?{Nehemyah Foushee  PTA ?Acute  Rehabilitation Services ?Pager      [phone] ?Office      [phone] ? ?

## 2021-09-24 NOTE — Plan of Care (Cosign Needed)
Completed during shift assessment ?

## 2021-09-24 NOTE — Addendum Note (Signed)
Addendum  created 09/24/21 1128 by Basilio Cairo, CRNA  ? Child order released for a procedure order, Clinical Note Signed, Intraprocedure Blocks edited, SmartForm saved  ?  ?

## 2021-09-24 NOTE — TOC Transition Note (Signed)
Transition of Care (TOC) - CM/SW Discharge Note ? ?Patient Details  ?Name: Jillian [name] ?MRN: [id number] ?Date of Birth: [birth year] ? ?Transition of Care (TOC) CM/SW Contact:  ?Sherie Don, LCSW ?Phone Number: ?09/24/2021, 12:07 PM ? ?Clinical Narrative: Patient is expected to discharge home after working with PT. CSW met with patient to confirm discharge plan. Patient will discharge home with OPPT at [facility]. Patient has a rolling walker and rollator at home. Patient reported she would not need a 3N1, so there are no DME needs at this time. TOC signing off. ? ?Final next level of care: OP Rehab ?Barriers to Discharge: No Barriers Identified ? ?Patient Goals and CMS Choice ?Patient states their goals for this hospitalization and ongoing recovery are:: Discharge home with OPPT at [facility] ?Choice offered to / list presented to : NA ? ?Discharge Plan and Services     ?DME Arranged: N/A ?DME Agency: NA ? ?Readmission Risk Interventions ?   ? View : No data to display.  ?  ?  ?  ? ?

## 2021-09-24 NOTE — Progress Notes (Signed)
Patient ambulating to the bathroom with NT. Her knee buckled, and NT was able to support her. Patient however reported that her surgical knee tapped the ground. NT was unable to confirm this secondary to gown obstructing NT's view of patient knee.  ? ?RN called to room to examine patient's knee.  ? ?ACE and Webrol taken down. No drainage, blood or edema noted at base of left knee.  ?Patient denied increase in pain.  ?Patient educated about informing RN staff with any increase in patient's pain level.  ? ?Primary RN, JT, notified.  ? ? ?SWhittemore, RN ?

## 2021-09-24 NOTE — Anesthesia Procedure Notes (Addendum)
Spinal ? ?Patient location during procedure: OR ?Start time: 09/23/2021 11:28 AM ?Reason for block: surgical anesthesia ?Staffing ?Performed: resident/CRNA  ?Anesthesiologist: Josephine Igo, MD ?Resident/CRNA: Gerald Leitz, CRNA ?Preanesthetic Checklist ?Completed: patient identified, IV checked, site marked, risks and benefits discussed, surgical consent, monitors and equipment checked, pre-op evaluation and timeout performed ?Spinal Block ?Patient position: sitting ?Prep: DuraPrep ?Patient monitoring: heart rate, continuous pulse ox, blood pressure and cardiac monitor ?Approach: midline ?Location: L3-4 ?Injection technique: single-shot ?Needle ?Needle type: Introducer and Pencan  ?Needle gauge: 25 G ?Needle length: 9 cm ?Assessment ?Sensory level: T4 ?Events: CSF return ?Additional Notes ?Negative paresthesia. Negative blood return. Positive free-flowing CSF. Expiration date of kit checked and confirmed. Patient tolerated procedure well, without complications. ? ? ? ? ? ?

## 2021-09-25 LAB — CBC
HCT: 36.7 % (ref 36.0–46.0)
Hemoglobin: 11.7 g/dL — ABNORMAL LOW (ref 12.0–15.0)
MCH: 28.2 pg (ref 26.0–34.0)
MCHC: 31.9 g/dL (ref 30.0–36.0)
MCV: 88.4 fL (ref 80.0–100.0)
Platelets: 152 10*3/uL (ref 150–400)
RBC: 4.15 MIL/uL (ref 3.87–5.11)
RDW: 14.3 % (ref 11.5–15.5)
WBC: 12 10*3/uL — ABNORMAL HIGH (ref 4.0–10.5)
nRBC: 0 % (ref 0.0–0.2)

## 2021-09-25 LAB — BASIC METABOLIC PANEL
Anion gap: 5 (ref 5–15)
BUN: 12 mg/dL (ref 8–23)
CO2: 29 mmol/L (ref 22–32)
Calcium: 8.7 mg/dL — ABNORMAL LOW (ref 8.9–10.3)
Chloride: 106 mmol/L (ref 98–111)
Creatinine, Ser: 0.41 mg/dL — ABNORMAL LOW (ref 0.44–1.00)
GFR, Estimated: >60 mL/min (ref >60)
Glucose, Bld: 111 mg/dL — ABNORMAL HIGH (ref 70–99)
Potassium: 4 mmol/L (ref 3.5–5.1)
Sodium: 140 mmol/L (ref 135–145)

## 2021-09-25 MED ORDER — RIVAROXABAN 10 MG PO TABS: 10.0000 mg | ORAL_TABLET | Freq: Every day | ORAL | 0 refills | Status: AC

## 2021-09-25 MED ORDER — METHOCARBAMOL 500 MG PO TABS: 500.0000 mg | ORAL_TABLET | Freq: Four times a day (QID) | ORAL | 0 refills | Status: AC | PRN

## 2021-09-25 MED ORDER — HYDROMORPHONE HCL 2 MG PO TABS: 2.0000 mg | ORAL_TABLET | Freq: Four times a day (QID) | ORAL | 0 refills | Status: DC | PRN

## 2021-09-25 MED ORDER — TRAMADOL HCL 50 MG PO TABS: 50.0000 mg | ORAL_TABLET | Freq: Four times a day (QID) | ORAL | 0 refills | Status: DC | PRN

## 2021-09-25 NOTE — Plan of Care (Signed)
?  Problem: Clinical Measurements: ?Goal: Ability to maintain clinical measurements within normal limits will improve ?Outcome: Adequate for Discharge ?Goal: Will remain free from infection ?Outcome: Adequate for Discharge ?Goal: Diagnostic test results will improve ?Outcome: Adequate for Discharge ?Goal: Respiratory complications will improve ?Outcome: Adequate for Discharge ?Goal: Cardiovascular complication will be avoided ?Outcome: Adequate for Discharge ?  ?Problem: Activity: ?Goal: Risk for activity intolerance will decrease ?Outcome: Adequate for Discharge ?  ?Problem: Nutrition: ?Goal: Adequate nutrition will be maintained ?Outcome: Adequate for Discharge ?  ?Problem: Coping: ?Goal: Level of anxiety will decrease ?Outcome: Adequate for Discharge ?  ?Problem: Elimination: ?Goal: Will not experience complications related to bowel motility ?Outcome: Adequate for Discharge ?Goal: Will not experience complications related to urinary retention ?Outcome: Adequate for Discharge ?  ?Problem: Pain Managment: ?Goal: General experience of comfort will improve ?Outcome: Adequate for Discharge ?  ?Problem: Safety: ?Goal: Ability to remain free from injury will improve ?Outcome: Adequate for Discharge ?  ?Problem: Skin Integrity: ?Goal: Risk for impaired skin integrity will decrease ?Outcome: Adequate for Discharge ?  ?Problem: Education: ?Goal: Knowledge of the prescribed therapeutic regimen will improve ?Outcome: Adequate for Discharge ?  ?Problem: Activity: ?Goal: Ability to avoid complications of mobility impairment will improve ?Outcome: Adequate for Discharge ?Goal: Range of joint motion will improve ?Outcome: Adequate for Discharge ?  ?Problem: Clinical Measurements: ?Goal: Postoperative complications will be avoided or minimized ?Outcome: Adequate for Discharge ?  ?Problem: Pain Management: ?Goal: Pain level will decrease with appropriate interventions ?Outcome: Adequate for Discharge ?  ?Problem: Skin  Integrity: ?Goal: Will show signs of wound healing ?Outcome: Adequate for Discharge ?  ?

## 2021-09-25 NOTE — Progress Notes (Signed)
? ?  Subjective: ?2 Days Post-Op Procedure(s) (LRB): ?TOTAL KNEE ARTHROPLASTY (Right) ?Patient reports pain as mild.   ?Patient seen in rounds by Dr. Wynelle Link. ?Patient is well, and has had no acute complaints or problems other than discomfort in the right knee. Denies chest pain or SOB. States she is ready to go home.  ?Plan is to go Home after hospital stay. ? ?Objective: ?Vital signs in last 24 hours: ?Temp:  [98 ?F (36.7 ?C)-98.4 ?F (36.9 ?C)] 98.4 ?F (36.9 ?C) (04/12 0700) ?Pulse Rate:  [64-77] 65 (04/12 0700) ?Resp:  [16-18] 16 (04/12 0700) ?BP: (101-120)/(57-77) 112/57 (04/12 0700) ?SpO2:  [92 %-95 %] 94 % (04/12 0700) ? ?Intake/Output from previous day: ? ?Intake/Output Summary (Last 24 hours) at 09/25/2021 0832 ?Last data filed at 09/24/2021 1750 ?Gross per 24 hour  ?Intake 82.42 ml  ?Output 1350 ml  ?Net -1267.58 ml  ?  ?Intake/Output this shift: ?No intake/output data recorded. ? ?Labs: ?Recent Labs  ?  09/24/21 ?0324 09/25/21 ?0343  ?HGB 13.2 11.7*  ? ?Recent Labs  ?  09/24/21 ?0324 09/25/21 ?0343  ?WBC 14.0* 12.0*  ?RBC 4.63 4.15  ?HCT 40.6 36.7  ?PLT 170 152  ? ?Recent Labs  ?  09/24/21 ?0324 09/25/21 ?0343  ?NA 134* 140  ?K 4.6 4.0  ?CL 102 106  ?CO2 26 29  ?BUN 9 12  ?CREATININE 0.64 0.41*  ?GLUCOSE 150* 111*  ?CALCIUM 8.9 8.7*  ? ?No results for input(s): LABPT, INR in the last 72 hours. ? ?Exam: ?General - Patient is Alert and Oriented ?Extremity - Neurologically intact ?Neurovascular intact ?Sensation intact distally ?Dorsiflexion/Plantar flexion intact ?Dressing/Incision - clean, dry, no drainage ?Motor Function - intact, moving foot and toes well on exam.  ? ?Past Medical History:  ?Diagnosis Date  ? Adjustment disorder, unspecified   ? Arthritis   ? Coronary artery disease   ? Edema leg   ? GAD (generalized anxiety disorder)   ? GERD (gastroesophageal reflux disease)   ? Myocardial infarction [facility]) 1995  ? saw dr Claiborne Billings   ? PONV (postoperative nausea and vomiting)   ? Tremor, unspecified   ?  Unspecified mood (affective) disorder ([place])   ? 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 ? ?Assessment/Plan: ?2 Days Post-Op Procedure(s) (LRB): ?TOTAL KNEE ARTHROPLASTY (Right) ?Principal Problem: ?  OA (osteoarthritis) of knee ?Active Problems: ?  Osteoarthritis of right knee ? ?Estimated body mass index is 28.87 kg/m? as calculated from the following: ?  Height as of this encounter: 5\' 3"  (1.6 m). ?  Weight as of this encounter: 73.9 kg. ?Up with therapy ? ?DVT Prophylaxis - Xarelto ?Weight-bearing as tolerated ? ?Plan for discharge to home once cleared by PT. ?Scheduled for OPPT at [facility] ?Follow-up in the office in 2 weeks ? ?The PDMP database was reviewed today prior to any opioid medications being prescribed to this patient. ? ? ?Theresa Duty, PA-C ?Orthopedic Surgery ?[phone]) [id number] ?09/25/2021, 8:32 AM ? ?

## 2021-09-25 NOTE — Progress Notes (Signed)
Physical Therapy Treatment ?Patient Details ?Name: Jillian [name] ?MRN: [id number] ?DOB: 1950-06-21 ?Today's Date: 09/25/2021 ? ? ?History of Present Illness Pt is a 71yo female presenting s/p R-TKA on 09/23/21. PMH: OA, CAD, GERD, MI, anxiety, OA. ? ?  ?PT Comments  ? ? Pt is POD # 2 and is progressing well.  Pt with improved stability -not requiring KI and was able to ambulate 5' and performed 5 steps with bil rails.  Pt with good quad activation, pain control, and ROM.  She has support at d/c. Pt demonstrates safe gait & transfers in order to return home from PT perspective once discharged by MD.  While in hospital, will continue to benefit from PT for skilled therapy to advance mobility and exercises.   ?   ?Recommendations for follow up therapy are one component of a multi-disciplinary discharge planning process, led by the attending physician.  Recommendations may be updated based on patient status, additional functional criteria and insurance authorization. ? ?Follow Up Recommendations ? Follow physician's recommendations for discharge plan and follow up therapies ?  ?  ?Assistance Recommended at Discharge Set up Supervision/Assistance  ?Patient can return home with the following A little help with walking and/or transfers;A little help with bathing/dressing/bathroom;Assistance with cooking/housework;Help with stairs or ramp for entrance;Assist for transportation ?  ?Equipment Recommendations ? None recommended by PT  ?  ?Recommendations for Other Services   ? ? ?  ?Precautions / Restrictions Precautions ?Precautions: Fall ?Required Braces or Orthoses: Knee Immobilizer - Right ?Knee Immobilizer - Left: Discontinue once straight leg raise with < 10 degree lag ?Restrictions ?Other Position/Activity Restrictions: wbat  ?  ? ?Mobility ? Bed Mobility ?Overal bed mobility: Needs Assistance ?Bed Mobility: Supine to Sit ?  ?  ?Supine to sit: Min guard ?  ?  ?  ?  ? ?Transfers ?Overall transfer level: Needs  assistance ?Equipment used: Rolling walker (2 wheels) ?Transfers: Sit to/from Stand ?Sit to Stand: Supervision ?  ?  ?  ?  ?  ?General transfer comment: Cued for hand placement ?  ? ?Ambulation/Gait ?Ambulation/Gait assistance: Min guard ?Gait Distance (Feet): 70 Feet ?Assistive device: Rolling walker (2 wheels) ?Gait Pattern/deviations: Step-to pattern, Decreased stance time - right ?Gait velocity: decreased ?  ?  ?General Gait Details: Step to gait with min cues for RW proximity ? ? ?Stairs ?Stairs: Yes ?Stairs assistance: Min guard ?Stair Management: Two rails, Step to pattern ?Number of Stairs: 5 ?General stair comments: cued/educated on sequencing ? ? ?Wheelchair Mobility ?  ? ?Modified Rankin (Stroke Patients Only) ?  ? ? ?  ?Balance Overall balance assessment: Needs assistance ?Sitting-balance support: Feet supported, No upper extremity supported ?Sitting balance-Leahy Scale: Good ?  ?  ?Standing balance support: Bilateral upper extremity supported, Reliant on assistive device for balance ?Standing balance-Leahy Scale: Poor ?Standing balance comment: used RW but stable ?  ?  ?  ?  ?  ?  ?  ?  ?  ?  ?  ?  ? ?  ?Cognition Arousal/Alertness: Awake/alert ?Behavior During Therapy: [facility] for tasks assessed/performed ?Overall Cognitive Status: Within Functional Limits for tasks assessed ?  ?  ?  ?  ?  ?  ?  ?  ?  ?  ?  ?  ?  ?  ?  ?  ?General Comments: Very pleasant and motivated.  Mildly anxious ?  ?  ? ?  ?Exercises Total Joint Exercises ?Ankle Circles/Pumps: AROM, Both, 10 reps, Seated ?Quad Sets: AROM, Both, 10 reps,  Supine ?Heel Slides: AAROM, Right, 10 reps, Supine ?Hip ABduction/ADduction: AAROM, Right, 10 reps, Supine ?Long Arc Quad: AROM, Right, 10 reps, Seated ?Knee Flexion: AROM, Right, 10 reps, Seated ?Goniometric ROM: R knee 0 to 80 degreea ?Other Exercises ?Other Exercises: cued/educated on AAROM techniques with gait belt ? ?  ?General Comments  Educated on safe ice use, no pivots, car transfers, resting  with leg straight, and TED hose during day. Also, encouraged walking every 1-2 hours during day. Educated on HEP with focus on mobility the first weeks. Discussed doing exercises within pain control and if pain increasing could decreased ROM, reps, and stop exercises as needed. Encouraged to perform quad sets and ankle pumps frequently for blood flow and to promote full knee extension. ? ?  ?  ? ?Pertinent Vitals/Pain Pain Assessment ?Pain Assessment: 0-10 ?Pain Score: 3  ?Pain Location: R knee ?Pain Descriptors / Indicators: Discomfort ?Pain Intervention(s): Limited activity within patient's tolerance, Monitored during session  ? ? ?Home Living   ?  ?  ?  ?  ?  ?  ?  ?  ?  ?   ?  ?Prior Function    ?  ?  ?   ? ?PT Goals (current goals can now be found in the care plan section) Progress towards PT goals: Progressing toward goals ? ?  ?Frequency ? ? ? 7X/week ? ? ? ?  ?PT Plan Current plan remains appropriate  ? ? ?Co-evaluation   ?  ?  ?  ?  ? ?  ?AM-PAC PT "6 Clicks" Mobility   ?Outcome Measure ? Help needed turning from your back to your side while in a flat bed without using bedrails?: A Little ?Help needed moving from lying on your back to sitting on the side of a flat bed without using bedrails?: A Little ?Help needed moving to and from a bed to a chair (including a wheelchair)?: A Little ?Help needed standing up from a chair using your arms (e.g., wheelchair or bedside chair)?: A Little ?Help needed to walk in hospital room?: A Little ?Help needed climbing 3-5 steps with a railing? : A Little ?6 Click Score: 18 ? ?  ?End of Session Equipment Utilized During Treatment: Gait belt ?Activity Tolerance: Patient tolerated treatment well ?Patient left: in chair;with call bell/phone within reach;with chair alarm set ?Nurse Communication: Mobility status ?PT Visit Diagnosis: Pain;Difficulty in walking, not elsewhere classified (R26.2) ?Pain - Right/Left: Right ?Pain - part of body: Knee ?  ? ? ?Time: [id number] ?PT Time  Calculation (min) (ACUTE ONLY): 36 min ? ?Charges:  $Gait Training: 8-22 mins ?$Therapeutic Exercise: 8-22 mins          ?          ? ?Abran Richard, PT ?Acute Rehab Services ?Pager [phone] ?[name] [id number] ? ? ? ?Jillian [name] Jillian [name] ?09/25/2021, 12:04 PM ? ?

## 2021-09-27 NOTE — Discharge Summary (Signed)
Patient ID: ?Jillian [name] ?MRN: [id number] ?DOB/AGE: [birth year] 71 y.o. ? ?Admit date: 09/23/2021 ?Discharge date: 09/25/2021 ? ?Admission Diagnoses:  ?Principal Problem: ?  OA (osteoarthritis) of knee ?Active Problems: ?  Osteoarthritis of right knee ? ? ?Discharge Diagnoses:  ?Same ? ?Past Medical History:  ?Diagnosis Date  ? Adjustment disorder, unspecified   ? Arthritis   ? Coronary artery disease   ? Edema leg   ? GAD (generalized anxiety disorder)   ? GERD (gastroesophageal reflux disease)   ? Myocardial infarction [facility]) 1995  ? saw dr Tresa Endo   ? PONV (postoperative nausea and vomiting)   ? Tremor, unspecified   ? Unspecified mood (affective) disorder (HCC)   ? 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 ? ?Surgeries: Procedure(s): ?TOTAL KNEE ARTHROPLASTY on 09/23/2021 ?  ?Consultants:  ? ?Discharged Condition: Improved ? ?Hospital Course: KARIN PINEDO is an 71 y.o. female who was admitted 09/23/2021 for operative treatment ofOA (osteoarthritis) of knee. Patient has severe unremitting pain that affects sleep, daily activities, and work/hobbies. After pre-op clearance the patient was taken to the operating room on 09/23/2021 and underwent  Procedure(s): ?TOTAL KNEE ARTHROPLASTY.   ? ?Patient was given perioperative antibiotics:  ?Anti-infectives (From admission, onward)  ? ? Start     Dose/Rate Route Frequency Ordered Stop  ? 09/23/21 1800  ceFAZolin (ANCEF) IVPB 2g/100 mL premix       ? 2 g ?200 mL/hr over 30 Minutes Intravenous Every 6 hours 09/23/21 1425 09/24/21 0113  ? 09/23/21 0900  ceFAZolin (ANCEF) IVPB 2g/100 mL premix       ? 2 g ?200 mL/hr over 30 Minutes Intravenous On call to O.R. 09/23/21 3361 09/23/21 1153  ? ?  ?  ? ?Patient was given sequential compression devices, early ambulation, and chemoprophylaxis to prevent DVT. ? ?Patient benefited maximally from hospital stay and there were no complications.   ? ?Recent vital signs: No data found.  ? ?Recent laboratory studies:  ?Recent Labs  ?  09/25/21 ?0343   ?WBC 12.0*  ?HGB 11.7*  ?HCT 36.7  ?PLT 152  ?NA 140  ?K 4.0  ?CL 106  ?CO2 29  ?BUN 12  ?CREATININE 0.41*  ?GLUCOSE 111*  ?CALCIUM 8.7*  ? ? ? ?Discharge Medications:   ?Allergies as of 09/25/2021   ? ?   Reactions  ? Hydrocodone Nausea And Vomiting  ? Opium Nausea And Vomiting  ? Patient states all narcotics make her N/V  ? Nickel Rash  ? Severe rash  ? ?  ? ?  ?Medication List  ?  ? ?STOP taking these medications   ? ?ibuprofen 800 MG tablet ?Commonly known as: ADVIL ?  ?PRESERVISION AREDS 2 PO ?  ?Vitamin D (Ergocalciferol) 1.25 MG (50000 UNIT) Caps capsule ?Commonly known as: DRISDOL ?  ? ?  ? ?TAKE these medications   ? ?acetaminophen 500 MG tablet ?Commonly known as: TYLENOL ?Take 500 mg by mouth every 6 (six) hours as needed for moderate pain. ?  ?clonazePAM 0.5 MG tablet ?Commonly known as: KLONOPIN ?Take 0.5 mg by mouth daily as needed for anxiety. ?  ?docusate sodium 100 MG capsule ?Commonly known as: COLACE ?Take 100 mg by mouth daily. ?  ?famotidine 20 MG tablet ?Commonly known as: PEPCID ?Take 20 mg by mouth daily as needed for indigestion. ?  ?HYDROmorphone 2 MG tablet ?Commonly known as: DILAUDID ?Take 1-2 tablets (2-4 mg total) by mouth every 6 (six) hours as needed for severe pain. ?  ?  meclizine 25 MG tablet ?Commonly known as: ANTIVERT ?Take 25 mg by mouth 3 (three) times daily as needed for dizziness. ?  ?methocarbamol 500 MG tablet ?Commonly known as: ROBAXIN ?Take 1 tablet (500 mg total) by mouth every 6 (six) hours as needed for muscle spasms. ?  ?propranolol ER 60 MG 24 hr capsule ?Commonly known as: INDERAL LA ?Take 60 mg by mouth daily. ?  ?QUEtiapine 100 MG tablet ?Commonly known as: SEROQUEL ?Take 100 mg by mouth at bedtime. ?  ?rivaroxaban 10 MG Tabs tablet ?Commonly known as: XARELTO ?Take 1 tablet (10 mg total) by mouth daily with breakfast for 19 days. Then take one 81 mg aspirin once a day for three weeks. Then discontinue aspirin. ?  ?SYSTANE OP ?Place 1 drop into both eyes in the  morning and at bedtime. ?  ?traMADol 50 MG tablet ?Commonly known as: ULTRAM ?Take 1-2 tablets (50-100 mg total) by mouth every 6 (six) hours as needed for moderate pain. ?  ? ?  ? ?  ?  ? ? ?  ?Discharge Care Instructions  ?(From admission, onward)  ?  ? ? ?  ? ?  Start     Ordered  ? 09/25/21 0000  Weight bearing as tolerated       ? 09/25/21 0835  ? 09/25/21 0000  Change dressing       ?Comments: You may remove the bulky bandage (ACE wrap and gauze) two days after surgery. You will have an adhesive waterproof bandage underneath. Leave this in place until your first follow-up appointment.  ? 09/25/21 0835  ? ?  ?  ? ?  ? ? ?Diagnostic Studies: No results found. ? ?Disposition: Discharge disposition: 01-Home or Self Care ? ? ? ? ? ? ?Discharge Instructions   ? ? Call MD / Call 911   Complete by: As directed ?  ? If you experience chest pain or shortness of breath, CALL 911 and be transported to the hospital emergency room.  If you develope a fever above 101 F, pus (white drainage) or increased drainage or redness at the wound, or calf pain, call your surgeon's office.  ? Change dressing   Complete by: As directed ?  ? You may remove the bulky bandage (ACE wrap and gauze) two days after surgery. You will have an adhesive waterproof bandage underneath. Leave this in place until your first follow-up appointment.  ? Constipation Prevention   Complete by: As directed ?  ? Drink plenty of fluids.  Prune juice may be helpful.  You may use a stool softener, such as Colace (over the counter) 100 mg twice a day.  Use MiraLax (over the counter) for constipation as needed.  ? Diet - low sodium heart healthy   Complete by: As directed ?  ? Do not put a pillow under the knee. Place it under the heel.   Complete by: As directed ?  ? Driving restrictions   Complete by: As directed ?  ? No driving for two weeks  ? Post-operative opioid taper instructions:   Complete by: As directed ?  ? POST-OPERATIVE OPIOID TAPER INSTRUCTIONS: ?It  is important to wean off of your opioid medication as soon as possible. If you do not need pain medication after your surgery it is ok to stop day one. ?Opioids include: ?Codeine, Hydrocodone(Norco, Vicodin), Oxycodone(Percocet, oxycontin) and hydromorphone amongst others.  ?Long term and even short term use of opiods can cause: ?Increased pain response ?Dependence ?Constipation ?Depression ?Respiratory  depression ?And more.  ?Withdrawal symptoms can include ?Flu like symptoms ?Nausea, vomiting ?And more ?Techniques to manage these symptoms ?Hydrate well ?Eat regular healthy meals ?Stay active ?Use relaxation techniques(deep breathing, meditating, yoga) ?Do Not substitute Alcohol to help with tapering ?If you have been on opioids for less than two weeks and do not have pain than it is ok to stop all together.  ?Plan to wean off of opioids ?This plan should start within one week post op of your joint replacement. ?Maintain the same interval or time between taking each dose and first decrease the dose.  ?Cut the total daily intake of opioids by one tablet each day ?Next start to increase the time between doses. ?The last dose that should be eliminated is the evening dose.  ? ?  ? TED hose   Complete by: As directed ?  ? Use stockings (TED hose) for three weeks on both leg(s).  You may remove them at night for sleeping.  ? Weight bearing as tolerated   Complete by: As directed ?  ? ?  ? ? ? Follow-up Information   ? ? Ollen GrossAluisio, Frank, MD. Schedule an appointment as soon as possible for a visit in 2 week(s).   ?Specialty: Orthopedic Surgery ?Contact information: ?[address] ?[address] ?[place] [id number] ?[phone] ? ? ?  ?  ? ?  ?  ? ?  ? ? ? ?Signed: ?Arther AbbottKristie Addysen Louth ?09/27/2021, 3:36 PM ? ? ? ?

## 2021-12-30 ENCOUNTER — Other Ambulatory Visit: Payer: Self-pay | Admitting: Family Medicine

## 2021-12-30 DIAGNOSIS — E2839 Other primary ovarian failure: Secondary | ICD-10-CM

## 2022-02-01 IMAGING — DX DG CHEST 2V
2 series · 2 of 2 positions shown · non-contrast
Comparison: 10/30/2008

CLINICAL DATA: 70-year-old female with a history of left sided rib
pain

EXAM:
CHEST - 2 VIEW

[chest pa]
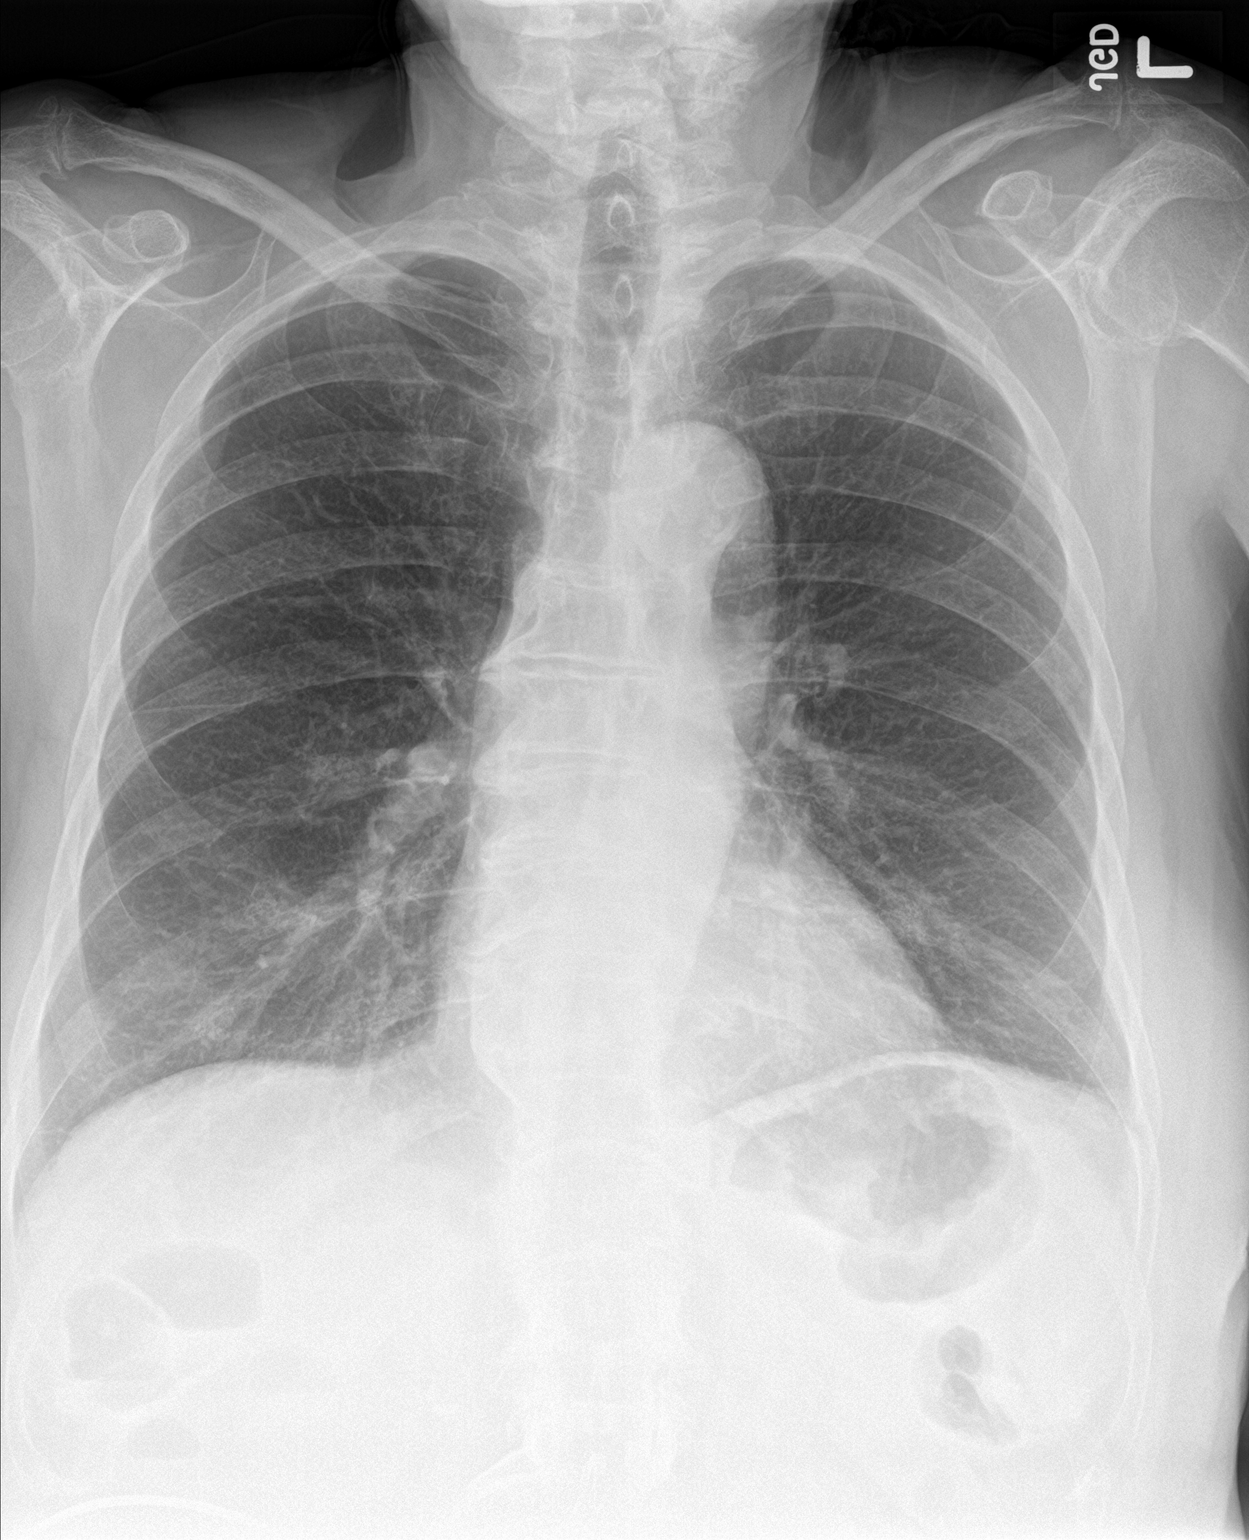

[chest lat]
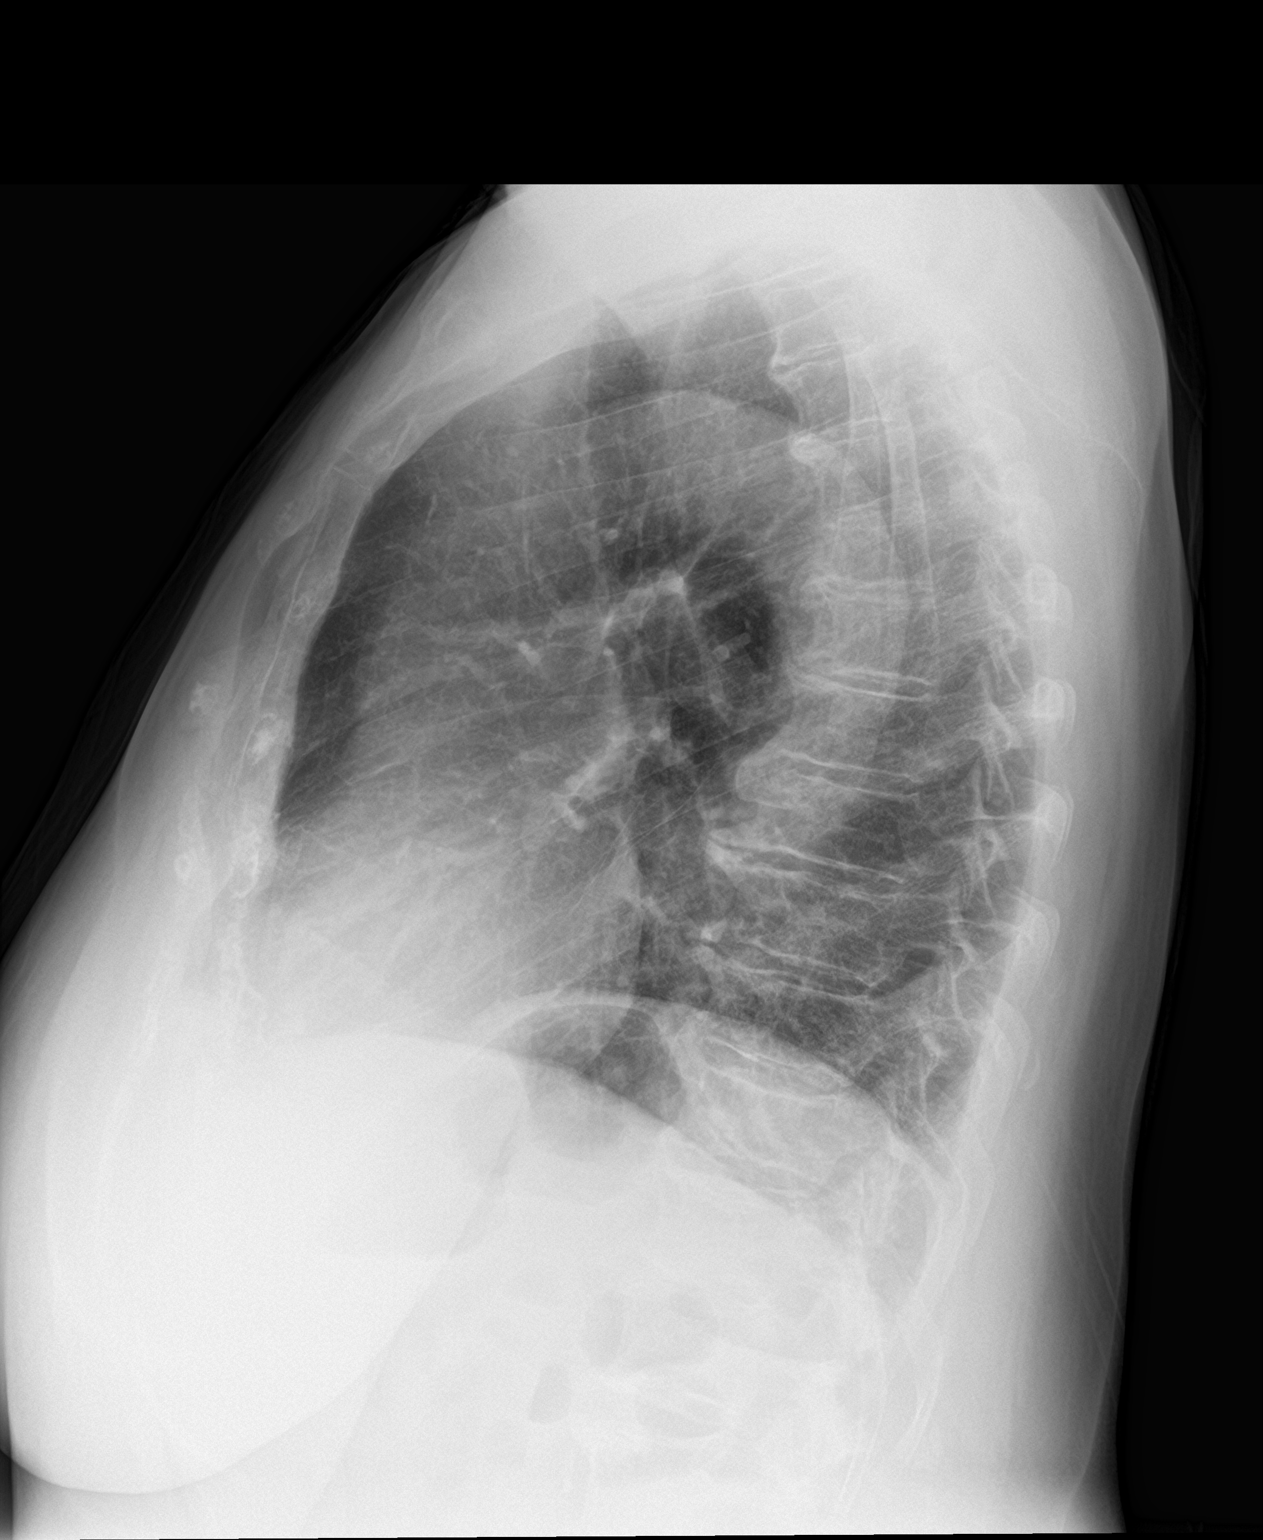

[2 of 2 positions shown; findings below may reference images not displayed]

FINDINGS: Cardiomediastinal silhouette unchanged in size and contour. No
evidence of central vascular congestion. No interlobular septal
thickening.

No pneumothorax or pleural effusion. Coarsened interstitial
markings, with no confluent airspace disease.

No acute displaced fracture. Degenerative changes of the spine.
IMPRESSION: Negative for acute cardiopulmonary disease

## 2022-04-28 ENCOUNTER — Other Ambulatory Visit: Payer: Self-pay

## 2022-04-28 ENCOUNTER — Emergency Department (HOSPITAL_COMMUNITY)
Admission: EM | Admit: 2022-04-28 | Discharge: 2022-04-29 | Disposition: A | Discharge status: Home / Self Care | Payer: Medicare Other | Attending: Emergency Medicine | Admitting: Emergency Medicine

## 2022-04-28 ENCOUNTER — Encounter (HOSPITAL_COMMUNITY): Payer: Self-pay

## 2022-04-28 ENCOUNTER — Emergency Department (HOSPITAL_COMMUNITY): Payer: Medicare Other

## 2022-04-28 DIAGNOSIS — I951 Orthostatic hypotension: Secondary | ICD-10-CM | POA: Insufficient documentation

## 2022-04-28 DIAGNOSIS — N390 Urinary tract infection, site not specified: Secondary | ICD-10-CM | POA: Insufficient documentation

## 2022-04-28 DIAGNOSIS — R42 Dizziness and giddiness: Secondary | ICD-10-CM | POA: Diagnosis present

## 2022-04-28 LAB — COMPREHENSIVE METABOLIC PANEL
ALT: 16 U/L (ref 0–44)
AST: 20 U/L (ref 15–41)
Albumin: 3.7 g/dL (ref 3.5–5.0)
Alkaline Phosphatase: 82 U/L (ref 38–126)
Anion gap: 9 (ref 5–15)
BUN: 11 mg/dL (ref 8–23)
CO2: 26 mmol/L (ref 22–32)
Calcium: 9.6 mg/dL (ref 8.9–10.3)
Chloride: 101 mmol/L (ref 98–111)
Creatinine, Ser: 0.6 mg/dL (ref 0.44–1.00)
GFR, Estimated: >60 mL/min (ref >60)
Glucose, Bld: 113 mg/dL — ABNORMAL HIGH (ref 70–99)
Potassium: 4.4 mmol/L (ref 3.5–5.1)
Sodium: 136 mmol/L (ref 135–145)
Total Bilirubin: 0.2 mg/dL — ABNORMAL LOW (ref 0.3–1.2)
Total Protein: 6.7 g/dL (ref 6.5–8.1)

## 2022-04-28 LAB — CBC WITH DIFFERENTIAL/PLATELET
Abs Immature Granulocytes: 0.02 10*3/uL (ref 0.00–0.07)
Basophils Absolute: 0.1 10*3/uL (ref 0.0–0.1)
Basophils Relative: 1 %
Eosinophils Absolute: 0 10*3/uL (ref 0.0–0.5)
Eosinophils Relative: 0 %
HCT: 47.4 % — ABNORMAL HIGH (ref 36.0–46.0)
Hemoglobin: 15 g/dL (ref 12.0–15.0)
Immature Granulocytes: 0 %
Lymphocytes Relative: 25 %
Lymphs Abs: 2.1 10*3/uL (ref 0.7–4.0)
MCH: 28 pg (ref 26.0–34.0)
MCHC: 31.6 g/dL (ref 30.0–36.0)
MCV: 88.4 fL (ref 80.0–100.0)
Monocytes Absolute: 0.5 10*3/uL (ref 0.1–1.0)
Monocytes Relative: 6 %
Neutro Abs: 5.9 10*3/uL (ref 1.7–7.7)
Neutrophils Relative %: 68 %
Platelets: 213 10*3/uL (ref 150–400)
RBC: 5.36 MIL/uL — ABNORMAL HIGH (ref 3.87–5.11)
RDW: 14.2 % (ref 11.5–15.5)
WBC: 8.7 10*3/uL (ref 4.0–10.5)
nRBC: 0 % (ref 0.0–0.2)

## 2022-04-28 NOTE — ED Triage Notes (Signed)
Patient arrived by EMS with complaint of 1 week of positional dizziness. This am the worse with extreme nausea. Received zofran by EMS prior to arrival. Speech clear, equal grips, no facial/mouth droop. No hx of vertigo

## 2022-04-28 NOTE — ED Provider Triage Note (Signed)
Emergency Medicine Provider Triage Evaluation Note  Jillian [name] , a 71 y.o. female  was evaluated in triage.  Pt complains of  dizziness since 8am when she awoke.  Pt reports weak all over.  Review of Systems  Positive: Weakness, dizziness Negative: fever  Physical Exam  There were no vitals taken for this visit. Gen:   Awake, no distress   Resp:  Normal effort  MSK:   Moves extremities without difficulty  Other:    Medical Decision Making  Medically screening exam initiated at 12:55 PM.  Appropriate orders placed.  Catha Gosselin was informed that the remainder of the evaluation will be completed by another provider, this initial triage assessment does not replace that evaluation, and the importance of remaining in the ED until their evaluation is complete.     Elson Areas, [place] 04/28/22 1257

## 2022-04-28 NOTE — ED Notes (Signed)
Called x3 for vitals and no response  

## 2022-04-29 ENCOUNTER — Emergency Department (HOSPITAL_COMMUNITY): Payer: Medicare Other

## 2022-04-29 LAB — URINALYSIS, ROUTINE W REFLEX MICROSCOPIC
Bilirubin Urine: NEGATIVE
Glucose, UA: NEGATIVE mg/dL
Hgb urine dipstick: NEGATIVE
Ketones, ur: NEGATIVE mg/dL
Nitrite: NEGATIVE
Protein, ur: 30 mg/dL — AB
Specific Gravity, Urine: 1.012 (ref 1.005–1.030)
WBC, UA: >50 WBC/hpf — ABNORMAL HIGH (ref 0–5)
pH: 5 (ref 5.0–8.0)

## 2022-04-29 MED ORDER — SODIUM CHLORIDE 0.9 % IV SOLN
1.0000 g | Freq: Once | INTRAVENOUS | Status: AC
  Administered 2022-04-29: 1 g via INTRAVENOUS
  Filled 2022-04-29: qty 10

## 2022-04-29 MED ORDER — CEPHALEXIN 500 MG PO CAPS: 500.0000 mg | ORAL_CAPSULE | Freq: Two times a day (BID) | ORAL | 0 refills | Status: DC

## 2022-04-29 MED ORDER — IOHEXOL 350 MG/ML SOLN
75.0000 mL | Freq: Once | INTRAVENOUS | Status: AC | PRN
  Administered 2022-04-29: 75 mL via INTRAVENOUS

## 2022-04-29 MED ORDER — SODIUM CHLORIDE 0.9 % IV BOLUS
500.0000 mL | Freq: Once | INTRAVENOUS | Status: AC
  Administered 2022-04-29: 500 mL via INTRAVENOUS

## 2022-04-29 MED ORDER — ONDANSETRON HCL 4 MG PO TABS: 4.0000 mg | ORAL_TABLET | Freq: Three times a day (TID) | ORAL | 0 refills | Status: DC | PRN

## 2022-04-29 NOTE — ED Provider Notes (Signed)
[facility] EMERGENCY DEPARTMENT Provider Note   CSN: [id number] Arrival date & time: 04/28/22  1233     History  No chief complaint on file.   Jillian [name] is a 71 y.o. female.  The history is provided by the patient and medical records.  Jillian [name] is a 71 y.o. female who presents to the Emergency Department complaining of dizziness.  She presents to the emergency department complaining of dizziness.  She states that symptoms started yesterday when she began to feel nauseous and a little bit unsteady when she would get up to walk.  Symptoms are waxing and waning.  She has occasional episodes over the last several weeks but symptoms have been the most pronounced over the last 24 hours.  She also reports mild dysuria.  No fevers, chest pain, shortness of breath, vomiting, abdominal pain, leg swelling or pain.  She has a remote history of MI.  She states that her blood pressures run low at baseline. She has a remote hx/o DVT, not on anticoagulation  She did have knee surgery in April of this year.  No hormone use.     Home Medications Prior to Admission medications   Medication Sig Start Date End Date Taking? Authorizing Provider  cephALEXin (KEFLEX) 500 MG capsule Take 1 capsule (500 mg total) by mouth 2 (two) times daily. 04/29/22  Yes Tilden Fossa, MD  ondansetron (ZOFRAN) 4 MG tablet Take 1 tablet (4 mg total) by mouth every 8 (eight) hours as needed for nausea or vomiting. 04/29/22  Yes Tilden Fossa, MD  acetaminophen (TYLENOL) 500 MG tablet Take 500 mg by mouth every 6 (six) hours as needed for moderate pain.    [provider]  clonazePAM (KLONOPIN) 0.5 MG tablet Take 0.5 mg by mouth daily as needed for anxiety. 07/07/21   [provider]  docusate sodium (COLACE) 100 MG capsule Take 100 mg by mouth daily.    [provider]  famotidine (PEPCID) 20 MG tablet Take 20 mg by mouth daily as needed for indigestion.    [provider]  HYDROmorphone (DILAUDID) 2 MG tablet Take 1-2 tablets (2-4 mg total) by mouth every 6 (six) hours as needed for severe pain. 09/25/21   Edmisten, Lyn Hollingshead, PA  meclizine (ANTIVERT) 25 MG tablet Take 25 mg by mouth 3 (three) times daily as needed for dizziness.    [provider]  methocarbamol (ROBAXIN) 500 MG tablet Take 1 tablet (500 mg total) by mouth every 6 (six) hours as needed for muscle spasms. 09/25/21   Edmisten, Lyn Hollingshead, PA  Polyethyl Glycol-Propyl Glycol (SYSTANE OP) Place 1 drop into both eyes in the morning and at bedtime.    [provider]  propranolol ER (INDERAL LA) 60 MG 24 hr capsule Take 60 mg by mouth daily. 02/13/20   [provider]  QUEtiapine (SEROQUEL) 100 MG tablet Take 100 mg by mouth at bedtime.     [provider]  traMADol (ULTRAM) 50 MG tablet Take 1-2 tablets (50-100 mg total) by mouth every 6 (six) hours as needed for moderate pain. 09/25/21   Edmisten, Lyn Hollingshead, PA      Allergies    Hydrocodone, Opium, and Nickel    Review of Systems   Review of Systems  All other systems reviewed and are negative.   Physical Exam Updated Vital Signs BP 106/69 (BP Location: Right Arm)   Pulse 87   Temp 97.7 F (36.5 C) (Oral)  Resp 20   SpO2 97%  Physical Exam Vitals and nursing note reviewed.  Constitutional:      Appearance: She is well-developed.  HENT:     Head: Normocephalic and atraumatic.  Cardiovascular:     Rate and Rhythm: Normal rate and regular rhythm.     Heart sounds: No murmur heard. Pulmonary:     Effort: Pulmonary effort is normal. No respiratory distress.     Breath sounds: Normal breath sounds.  Abdominal:     Palpations: Abdomen is soft.     Tenderness: There is no abdominal tenderness. There is no guarding or rebound.  Musculoskeletal:        General: No tenderness.  Skin:    General: Skin is warm and dry.  Neurological:     Mental Status: She is alert and oriented to person,  place, and time.     Comments: No asymmetry of facial movements.  No pronator drift.  Visual fields grossly intact.  5 out of 5 strength in all 4 extremities with sensation to light touch intact in all 4 extremities.  Steady gait.  Psychiatric:        Behavior: Behavior normal.     ED Results / Procedures / Treatments   Labs (all labs ordered are listed, but only abnormal results are displayed) Labs Reviewed  CBC WITH DIFFERENTIAL/PLATELET - Abnormal; Notable for the following components:      Result Value   RBC 5.36 (*)    HCT 47.4 (*)    All other components within normal limits  COMPREHENSIVE METABOLIC PANEL - Abnormal; Notable for the following components:   Glucose, Bld 113 (*)    Total Bilirubin 0.2 (*)    All other components within normal limits  URINALYSIS, ROUTINE W REFLEX MICROSCOPIC - Abnormal; Notable for the following components:   Color, Urine AMBER (*)    APPearance TURBID (*)    Protein, ur 30 (*)    Leukocytes,Ua LARGE (*)    WBC, UA >50 (*)    Bacteria, UA MANY (*)    All other components within normal limits  URINE CULTURE    EKG EKG Interpretation  Date/Time:  Monday April 28 2022 12:43:54 EST Ventricular Rate:  65 PR Interval:  148 QRS Duration: 68 QT Interval:  386 QTC Calculation: 401 R Axis:   24 Text Interpretation: Normal sinus rhythm Anterior infarct , age undetermined Abnormal ECG Confirmed by Tilden Fossa [phone]) on 04/29/2022 5:54:23 AM  Radiology CT Angio Chest PE W/Cm &/Or Wo Cm  Result Date: 04/29/2022 CLINICAL DATA:  Pulmonary embolism suspected. High probability. Complains of dizziness and weakness. EXAM: CT ANGIOGRAPHY CHEST WITH CONTRAST TECHNIQUE: Multidetector CT imaging of the chest was performed using the standard protocol during bolus administration of intravenous contrast. Multiplanar CT image reconstructions and MIPs were obtained to evaluate the vascular anatomy. RADIATION DOSE REDUCTION: This exam was performed  according to the departmental dose-optimization program which includes automated exposure control, adjustment of the mA and/or kV according to patient size and/or use of iterative reconstruction technique. CONTRAST:  73mL OMNIPAQUE IOHEXOL 350 MG/ML SOLN COMPARISON:  Portable chest yesterday, PA Lat chest 06/13/2021. No prior Yamamoto-sectional imaging for comparison. FINDINGS: Cardiovascular: The cardiac size is normal. There is no pericardial effusion. There are mild scattered calcifications in the proximal LAD coronary artery. No other coronary calcification is seen. The pulmonary arteries and veins are normal in caliber. No arterial embolism is seen. There is patchy aortic calcific plaque in the arch and descending segments  and scattered calcifications of the great vessels. No stenosis, aneurysm or dissection is evident. Mediastinum/Nodes: No enlarged mediastinal, hilar, or axillary lymph nodes. The inferior poles of the Thyroid gland, the trachea, and thoracic esophagus demonstrate no significant findings. Lungs/Pleura: There is mild bronchial thickening in the lower lobes. In the lower lobe basal infrahilar areas on the left more so than right, there are patchy ground-glass opacities which could be microatelectasis or pneumonitis. In anterior segment of the right upper lobe there is a 1.3 cm thin walled air cyst. Remaining lungs are unremarkable. No mass is seen or nodules. There is no pleural effusion, thickening or pneumothorax. Upper Abdomen: No acute abnormality. Partial fatty atrophy of the pancreas. Musculoskeletal: There are prominent marginal osteophytes and multilevel degenerative discs of the thoracic spine. Osteophytic bridging at multiple levels. Mild thoracic dextroscoliosis. The ribcage is intact. No visible chest wall mass. Review of the MIP images confirms the above findings. IMPRESSION: 1. No evidence of arterial dilatation or embolus. 2. Aortic and coronary artery atherosclerosis. 3. Lower lobe  bronchial thickening consistent with bronchitis. 4. In the lower lobe basal infrahilar areas on the left more so than right, there are patchy ground-glass opacities which could be microatelectasis or pneumonitis. Short interval follow-up study recommended to ensure clearing. 5. Additional findings described above. Aortic Atherosclerosis (ICD10-I70.0). Electronically Signed   By: Almira Bar M.D.   On: 04/29/2022 07:03   CT Head Wo Contrast  Result Date: 04/28/2022 CLINICAL DATA:  Positional dizziness, central vertigo, worsened symptoms this morning with extreme nausea EXAM: CT HEAD WITHOUT CONTRAST TECHNIQUE: Contiguous axial images were obtained from the base of the skull through the vertex without intravenous contrast. RADIATION DOSE REDUCTION: This exam was performed according to the departmental dose-optimization program which includes automated exposure control, adjustment of the mA and/or kV according to patient size and/or use of iterative reconstruction technique. COMPARISON:  06/30/2017 FINDINGS: Brain: Generalized atrophy. Normal ventricular morphology. No midline shift or mass effect. Small vessel chronic ischemic changes of deep cerebral white matter. No intracranial hemorrhage, mass lesion, evidence of acute infarction, or extra-axial fluid collection. Vascular: No hyperdense vessels Skull: Intact Sinuses/Orbits: Clear Other: N/A IMPRESSION: Atrophy with small vessel chronic ischemic changes of deep cerebral white matter. No acute intracranial abnormalities. Electronically Signed   By: Ulyses Southward M.D.   On: 04/28/2022 14:18   DG Chest 1 View  Result Date: 04/28/2022 CLINICAL DATA:  Dizziness, weakness. EXAM: CHEST  1 VIEW COMPARISON:  June 13, 2021. FINDINGS: The heart size and mediastinal contours are within normal limits. Both lungs are clear. The visualized skeletal structures are unremarkable. IMPRESSION: No active disease. Electronically Signed   By: Lupita Raider M.D.   On:  04/28/2022 13:21    Procedures Procedures    Medications Ordered in ED Medications  sodium chloride 0.9 % bolus 500 mL (500 mLs Intravenous New Bag/Given 04/29/22 0938)  cefTRIAXone (ROCEPHIN) 1 g in sodium chloride 0.9 % 100 mL IVPB (1 g Intravenous New Bag/Given 04/29/22 0625)  iohexol (OMNIPAQUE) 350 MG/ML injection 75 mL (75 mLs Intravenous Contrast Given 04/29/22 1829)    ED Course/ Medical Decision Making/ A&P                           Medical Decision Making Amount and/or Complexity of Data Reviewed Labs: ordered. Radiology: ordered.  Risk Prescription drug management.   Patient with remote history of DVT, MI here for evaluation of unsteadiness when standing, dysuria.  She is nontoxic-appearing on evaluation with no focal neurologic deficits.  CT head is negative for acute abnormality.  BMP with normal renal function.  CBC without significant anemia.  Given her history of DVT a CTA was obtained, which is negative for PE.  CT does demonstrate questionable bronchitis versus pneumonitis-patient without current cough, shortness of breath-feel acute pneumonia is unlikely.  Discussed findings with patient.  UA is concerning for UTI in setting of her symptoms-we will start antibiotics and send culture.  Patient is mildly orthostatic without hypotension in the emergency department.  Discussed with patient home care for orthostasis as well as UTI.  Discussed importance of outpatient follow-up as well as return precautions.        Final Clinical Impression(s) / ED Diagnoses Final diagnoses:  Acute UTI  Orthostasis    Rx / DC Orders ED Discharge Orders          Ordered    cephALEXin (KEFLEX) 500 MG capsule  2 times daily        04/29/22 0627    ondansetron (ZOFRAN) 4 MG tablet  Every 8 hours PRN        04/29/22 [id number]              Tilden Fossaees, Gustavia Carie, MD 04/29/22 [phone]

## 2022-05-01 LAB — URINE CULTURE

## 2022-05-02 ENCOUNTER — Telehealth (HOSPITAL_BASED_OUTPATIENT_CLINIC_OR_DEPARTMENT_OTHER): Payer: Self-pay

## 2022-05-02 NOTE — Telephone Encounter (Signed)
Post ED Visit - Positive Culture Follow-up  Culture report reviewed by antimicrobial stewardship pharmacist: [facility] [x]  , Pharm.D. []  Jerry Caras, Pharm.D., BCPS AQ-ID []  , Pharm.D., BCPS []  Celedonio Miyamoto, Pharm.D., BCPS []  [place], Garvin Fila.D., BCPS, AAHIVP []  , Pharm.D., BCPS, AAHIVP []  Georgina Pillion, PharmD, BCPS []  , PharmD, BCPS []  [place], PharmD, BCPS []  [address], PharmD []  , PharmD, BCPS []  Estella Husk, PharmD  Pharmacy Team []  Lysle Pearl, PharmD []  , PharmD []  Phillips Climes, PharmD []  , Rph []  Agapito Games) , PharmD []  Verlan Friends, PharmD []  , PharmD []  Mervyn Gay, PharmD []  , PharmD []  Vinnie Level, PharmD []  [facility], PharmD []  , PharmD []  Len Childs, PharmD   Positive urine culture Treated with Cephalexin, organism sensitive to the same and no further patient follow-up is required at this time.  05/02/2022, 10:20 AM

## 2023-12-13 ENCOUNTER — Ambulatory Visit (HOSPITAL_COMMUNITY)
Admission: RE | Admit: 2023-12-13 | Discharge: 2023-12-13 | Disposition: A | Discharge status: Home / Self Care | Source: Ambulatory Visit | Attending: Emergency Medicine | Admitting: Emergency Medicine

## 2023-12-13 ENCOUNTER — Encounter (HOSPITAL_COMMUNITY): Payer: Self-pay

## 2023-12-13 ENCOUNTER — Other Ambulatory Visit: Payer: Self-pay

## 2023-12-13 VITALS — BP 103/68 | HR 68 | Temp 98.3°F | Resp 18

## 2023-12-13 DIAGNOSIS — N3001 Acute cystitis with hematuria: Secondary | ICD-10-CM | POA: Insufficient documentation

## 2023-12-13 LAB — POCT URINALYSIS DIP (MANUAL ENTRY)
Bilirubin, UA: NEGATIVE
Glucose, UA: NEGATIVE mg/dL
Ketones, POC UA: NEGATIVE mg/dL
Nitrite, UA: POSITIVE — AB
Protein Ur, POC: 30 mg/dL — AB
Spec Grav, UA: 1.01 (ref 1.010–1.025)
Urobilinogen, UA: 0.2 U/dL
pH, UA: 5.5 (ref 5.0–8.0)

## 2023-12-13 MED ORDER — SULFAMETHOXAZOLE-TRIMETHOPRIM 800-160 MG PO TABS: 1.0000 | ORAL_TABLET | Freq: Two times a day (BID) | ORAL | 0 refills | Status: AC

## 2023-12-13 NOTE — ED Provider Notes (Signed)
 [facility]    CSN: [id number] Arrival date & time: 12/13/23  1233    HISTORY   Chief Complaint  Patient presents with   Dysuria   HPI Jillian [name] is a pleasant, 73 y.o. female who presents to urgent care today. Pt c/o burning sensation with urination for the past few days, states she is concerned about having UTI.  Patient denies abnormal vaginal discharge, vaginal itching, vaginal irritation, increased urge to urinate, sensation of incomplete emptying, incontinence of urine, flank pain, urine malodor, fever, body aches, chills, rigors, malaise, and significant fatigue.  Patient denies history of frequent urinary tract infections.  The history is provided by the patient.  Dysuria   Past Medical History:  Diagnosis Date   Adjustment disorder, unspecified    Arthritis    Coronary artery disease    Edema leg    GAD (generalized anxiety disorder)    GERD (gastroesophageal reflux disease)    Myocardial infarction [facility]) 1995   saw dr burnard    PONV (postoperative nausea and vomiting)    Tremor, unspecified    Unspecified mood (affective) disorder (HCC)    Vein disorder    lower legs   Patient Active Problem List   Diagnosis Date Noted   OA (osteoarthritis) of knee 09/23/2021   Osteoarthritis of right knee 09/23/2021   Allergic contact dermatitis 07/22/2021   Closed fracture of left patella 06/30/2017   Pre-diabetes 12/20/2015   Generalized anxiety disorder 09/20/2015   Major depressive disorder, recurrent episode, moderate (HCC) 09/20/2015   Venous insufficiency (chronic) (peripheral) 09/12/2015   Anxiety 09/04/2015   Insomnia 09/04/2015   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 08/28/2015   Gastroesophageal reflux disease without esophagitis 05/22/2015   Osteopenia 05/22/2015   Panic attacks 05/22/2015   Recurrent major depressive disorder (HCC) 05/22/2015   Tremor 05/22/2015   History of DVT of lower extremity 07/23/2012    LABYRINTHITIS 11/17/2007   HEADACHE 11/17/2007   Past Surgical History:  Procedure Laterality Date   CARDIAC CATHETERIZATION     COLONOSCOPY W/ POLYPECTOMY  06/17/2011   KNEE ARTHROSCOPY Right 11/23/2012   Procedure: RIGHT KNEE ARTHROSCOPY PARTIAL MEDIAL MENISECTOMY CHRONDROPLASTY;  Surgeon: Maude KANDICE Herald, MD;  Location: [facility];  Service: Orthopedics;  Laterality: Right;   TOTAL KNEE ARTHROPLASTY Right 09/23/2021   Procedure: TOTAL KNEE ARTHROPLASTY;  Surgeon: Melodi Lerner, MD;  Location: [facility];  Service: Orthopedics;  Laterality: Right;   TUBAL LIGATION  06/17/1971   VEIN SURGERY     OB History   No obstetric history on file.    Home Medications    Prior to Admission medications   Medication Sig Start Date End Date Taking? Authorizing Provider  acetaminophen  (TYLENOL ) 500 MG tablet Take 500 mg by mouth every 6 (six) hours as needed for moderate pain.    [provider]  cephALEXin  (KEFLEX ) 500 MG capsule Take 1 capsule (500 mg total) by mouth 2 (two) times daily. 04/29/22   Griselda Norris, MD  clonazePAM  (KLONOPIN ) 0.5 MG tablet Take 0.5 mg by mouth daily as needed for anxiety. 07/07/21   [provider]  docusate sodium  (COLACE) 100 MG capsule Take 100 mg by mouth daily.    [provider]  famotidine  (PEPCID ) 20 MG tablet Take 20 mg by mouth daily as needed for indigestion.    [provider]  HYDROmorphone  (DILAUDID ) 2 MG tablet Take 1-2 tablets (2-4 mg total) by mouth every 6 (six) hours as needed for  severe pain. 09/25/21   Edmisten, Kristie L, PA  meclizine  (ANTIVERT ) 25 MG tablet Take 25 mg by mouth 3 (three) times daily as needed for dizziness.    [provider]  methocarbamol  (ROBAXIN ) 500 MG tablet Take 1 tablet (500 mg total) by mouth every 6 (six) hours as needed for muscle spasms. 09/25/21   Edmisten, Kristie L, PA  ondansetron  (ZOFRAN ) 4 MG tablet Take 1 tablet (4 mg total) by mouth every 8 (eight) hours  as needed for nausea or vomiting. 04/29/22   Griselda Norris, MD  Polyethyl Glycol-Propyl Glycol (SYSTANE OP) Place 1 drop into both eyes in the morning and at bedtime.    [provider]  propranolol  ER (INDERAL  LA) 60 MG 24 hr capsule Take 60 mg by mouth daily. 02/13/20   [provider]  QUEtiapine  (SEROQUEL ) 100 MG tablet Take 100 mg by mouth at bedtime.     [provider]  traMADol  (ULTRAM ) 50 MG tablet Take 1-2 tablets (50-100 mg total) by mouth every 6 (six) hours as needed for moderate pain. 09/25/21   Edmisten, Roxie CROME, PA    Family History History reviewed. No pertinent family history. Social History Social History   Tobacco Use   Smoking status: Every Day    Current packs/day: 1.00    Average packs/day: 1 pack/day for 40.0 years (40.0 ttl pk-yrs)    Types: Cigarettes   Smokeless tobacco: Never  Vaping Use   Vaping status: Never Used  Substance Use Topics   Alcohol use: Yes    Comment: rare   Drug use: No   Allergies   Hydrocodone, Nickel, and Opium   Review of Systems Review of Systems  Genitourinary:  Positive for dysuria.   Pertinent findings revealed after performing a 14 point review of systems has been noted in the history of present illness.  Physical Exam Vital Signs BP 103/68 (BP Location: Right Arm)   Pulse 68   Temp 98.3 F (36.8 C) (Oral)   Resp 18   SpO2 98%   No data found.  Physical Exam  Visual Acuity Right Eye Distance:   Left Eye Distance:   Bilateral Distance:    Right Eye Near:   Left Eye Near:    Bilateral Near:     UC Couse / Diagnostics / Procedures:     Radiology No results found.  Procedures Procedures (including critical care time) EKG  Pending results:  Labs Reviewed  POCT URINALYSIS DIP (MANUAL ENTRY) - Abnormal; Notable for the following components:      Result Value   Clarity, UA cloudy (*)    Blood, UA moderate (*)    Protein Ur, POC =30 (*)    Nitrite, UA Positive (*)     Leukocytes, UA Large (3+) (*)    All other components within normal limits  URINE CULTURE    Medications Ordered in UC: Medications - No data to display  UC Diagnoses / Final Clinical Impressions(s)   I have reviewed the triage vital signs and the nursing notes.  Pertinent labs & imaging results that were available during my care of the patient were reviewed by me and considered in my medical decision making (see chart for details).    Final diagnoses:  Acute cystitis with hematuria   Urine dip today revealed nitrites, hematuria to a large degree, pyuria, and cloudy appearance.  Urine culture will be performed per our protocol.   Patient was advised to begin antibiotics now due to findings on urine  dip. Patient was advised to begin antibiotics today due to having active symptoms of urinary tract infection.                    Patient was advised to take all doses exactly as prescribed.  Patient also advised of risks of worsening infection with incomplete antibiotic therapy. Patient advised that they will be contacted with results and that adjustments to treatment will be provided as indicated based on the results.   Patient was advised of possibility that urine culture results may be negative if sample provided was obtained late in the day causing urine to be more diluted.  Patient was advised that if antibiotics were effective after the first 24 to 36 hours, despite negative urine culture result, it is recommended that they complete the full course as prescribed.   Return precautions advised.  Please see discharge instructions below for details of plan of care as provided to patient. ED Prescriptions     Medication Sig Dispense Auth. Provider   sulfamethoxazole-trimethoprim (BACTRIM DS) 800-160 MG tablet Take 1 tablet by mouth 2 (two) times daily for 3 days. 6 tablet Joesph Shaver Scales, PA-C      I have reviewed the PDMP during this encounter.  Disposition Upon Discharge:   Condition: stable for discharge home  Patient presented with concern for an acute illness with associated systemic symptoms and significant discomfort requiring urgent management. In my opinion, this is a condition that a prudent lay person (someone who possesses an average knowledge of health and medicine) may potentially expect to result in complications if not addressed urgently such as respiratory distress, impairment of bodily function or dysfunction of bodily organs.   As such, the patient has been evaluated and assessed, work-up was performed and treatment was provided in alignment with urgent care protocols and evidence based medicine.  Patient/parent/caregiver has been advised that the patient may require follow up for further testing and/or treatment if the symptoms continue in spite of treatment, as clinically indicated and appropriate.  Routine symptom specific, illness specific and/or disease specific instructions were discussed with the patient and/or caregiver at length.  Prevention strategies for avoiding STD exposure were also discussed.  The patient will follow up with their current PCP if and as advised. If the patient does not currently have a PCP we will assist them in obtaining one.   The patient may need specialty follow up if the symptoms continue, in spite of conservative treatment and management, for further workup, evaluation, consultation and treatment as clinically indicated and appropriate.  Patient/parent/caregiver verbalized understanding and agreement of plan as discussed.  All questions were addressed during visit.  Please see discharge instructions below for further details of plan.    Discharge Instructions      Common causes of urinary tract infections include but are not limited to holding your urine longer than you should, squatting instead of sitting down when urinating, sitting around in wet clothing such as a wet swimsuit or gym clothes too long, not  emptying your bladder after having sexual intercourse, wiping from back to front instead of front to back after having a bowel movement.     The urinalysis that we performed in the clinic today was abnormal.  Urine culture will be performed per our protocol.  The result of the urine culture will be available in the next 3 to 5 days and will be posted to your MyChart account.  If there is an abnormal finding, you will  be contacted by phone and advised of further treatment recommendations, if any.   Please pick up and begin taking your prescription for Bactrim DS (trimethoprim sulfamethoxazole) as soon as possible.  Please take all doses exactly as prescribed.  You can take this medication with or without food.  This medication is safe to take with your other medications.   If you receive a phone call advising you that your urine culture is negative but you begin to feel better after taking antibiotics for 24 hours, please feel free to complete the full course of antibiotics as they were likely needed and the urine culture result was false.  If your culture is negative and you do not feel any better, please return for repeat evaluation of other possible causes of your symptoms.   Please consider abstaining from sexual intercourse while you are being treated for urinary tract infection.   If you have not had complete resolution of your symptoms after completing treatment as prescribed, please return to urgent care for repeat evaluation or follow-up with your primary care provider.  Repeat urinalysis and urine culture may be indicated for more directed therapy.   Thank you for visiting [facility] today.  We appreciate the opportunity to participate in your care.       This office note has been dictated using Teaching laboratory technician.  Unfortunately, this method of dictation can sometimes lead to typographical or grammatical errors.  I apologize for your inconvenience in advance  if this occurs.  Please do not hesitate to reach out to me if clarification is needed.       Joesph Shaver Scales, PA-C 12/13/23 1346

## 2023-12-13 NOTE — Discharge Instructions (Signed)
 Common causes of urinary tract infections include but are not limited to holding your urine longer than you should, squatting instead of sitting down when urinating, sitting around in wet clothing such as a wet swimsuit or gym clothes too long, not emptying your bladder after having sexual intercourse, wiping from back to front instead of front to back after having a bowel movement.     The urinalysis that we performed in the clinic today was abnormal.  Urine culture will be performed per our protocol.  The result of the urine culture will be available in the next 3 to 5 days and will be posted to your MyChart account.  If there is an abnormal finding, you will be contacted by phone and advised of further treatment recommendations, if any.   Please pick up and begin taking your prescription for Bactrim DS (trimethoprim sulfamethoxazole) as soon as possible.  Please take all doses exactly as prescribed.  You can take this medication with or without food.  This medication is safe to take with your other medications.   If you receive a phone call advising you that your urine culture is negative but you begin to feel better after taking antibiotics for 24 hours, please feel free to complete the full course of antibiotics as they were likely needed and the urine culture result was false.  If your culture is negative and you do not feel any better, please return for repeat evaluation of other possible causes of your symptoms.   Please consider abstaining from sexual intercourse while you are being treated for urinary tract infection.   If you have not had complete resolution of your symptoms after completing treatment as prescribed, please return to urgent care for repeat evaluation or follow-up with your primary care provider.  Repeat urinalysis and urine culture may be indicated for more directed therapy.   Thank you for visiting [facility] today.  We appreciate the opportunity to participate in  your care.

## 2023-12-13 NOTE — ED Triage Notes (Signed)
 Pt c/o burning sensation with urination for the past few days, she is concerned about UTI.

## 2023-12-15 ENCOUNTER — Ambulatory Visit (HOSPITAL_COMMUNITY): Payer: Self-pay

## 2023-12-15 LAB — URINE CULTURE: Culture: >=100000 — AB

## 2024-02-02 ENCOUNTER — Ambulatory Visit: Admitting: Neurology

## 2024-03-29 ENCOUNTER — Ambulatory Visit: Admitting: Physical Therapy

## 2024-04-08 NOTE — Therapy (Signed)
 OUTPATIENT PHYSICAL THERAPY VESTIBULAR EVALUATION     Patient Name: Jillian [name] MRN: [id number] DOB:04/21/1951, 73 y.o., female Today's Date: 04/11/2024  END OF SESSION:  PT End of Session - 04/11/24 1236     Visit Number 1    Number of Visits 5    Date for Recertification  05/23/24   due to delay in scheduling   Authorization Type [employer]    PT Start Time 1232    PT Stop Time 1310    PT Time Calculation (min) 38 min    Activity Tolerance Patient tolerated treatment well    Behavior During Therapy WFL for tasks assessed/performed          Past Medical History:  Diagnosis Date   Adjustment disorder, unspecified    Arthritis    Coronary artery disease    Edema leg    GAD (generalized anxiety disorder)    GERD (gastroesophageal reflux disease)    Myocardial infarction [facility]) 1995   saw dr burnard    PONV (postoperative nausea and vomiting)    Tremor, unspecified    Unspecified mood (affective) disorder    Vein disorder    lower legs   Past Surgical History:  Procedure Laterality Date   CARDIAC CATHETERIZATION     COLONOSCOPY W/ POLYPECTOMY  06/17/2011   KNEE ARTHROSCOPY Right 11/23/2012   Procedure: RIGHT KNEE ARTHROSCOPY PARTIAL MEDIAL MENISECTOMY CHRONDROPLASTY;  Surgeon: Maude KANDICE Herald, MD;  Location: [facility];  Service: Orthopedics;  Laterality: Right;   TOTAL KNEE ARTHROPLASTY Right 09/23/2021   Procedure: TOTAL KNEE ARTHROPLASTY;  Surgeon: Melodi Lerner, MD;  Location: [facility];  Service: Orthopedics;  Laterality: Right;   TUBAL LIGATION  06/17/1971   VEIN SURGERY     Patient Active Problem List   Diagnosis Date Noted   OA (osteoarthritis) of knee 09/23/2021   Osteoarthritis of right knee 09/23/2021   Allergic contact dermatitis 07/22/2021   Closed fracture of left patella 06/30/2017   Pre-diabetes 12/20/2015   Generalized anxiety disorder 09/20/2015   Major depressive disorder, recurrent episode, moderate (HCC)  09/20/2015   Venous insufficiency (chronic) (peripheral) 09/12/2015   Anxiety 09/04/2015   Insomnia 09/04/2015   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 Feeling unsteady and dizzy. This has been going on for about 2 years. Started taking the Meclizine  a few weeks ago, takes it a few times a day as needed. Took one this morning. Feels like things are spinning sometimes.Tries to do breathing exercises and that will help the dizziness. When it is there in the morning, it will last all day and feel like things are spinning all day.  If she doesn't have anything in the morning, then will feel good all day. Has learned to not do quick turns. Uses her cae all the time to give her assurance and balance. Has fallen in her tub 2 times, and is afraid to get back in  it and now she gets in the shower. On a dizzy day is afraid of bending down to work in the flower beds. Reports an ear doctor told her to do a maneuver, and reports that does not help that much.  Pt accompanied by: self  PERTINENT HISTORY: PMH: CAD, GAD, MI (1995), R knee replacement, Dysfunction of right eustachian tube, essential tremor (per pt)  PAIN:  Are you having pain? No  Vitals:   04/11/24 1253  BP: 115/71  Pulse: (!) 59     PRECAUTIONS: Fall  FALLS: Has patient fallen in last 6 months? No  LIVING  ENVIRONMENT: Lives with: lives alone and and dog  Lives in: House/apartment Stairs: Yes: External: 5 steps; can reach both Has following equipment at home: Single point cane, Walker - 4 wheeled, and thinking about getting grab bars for her shower   PLOF: Independent, Independent with community mobility with device, and Leisure: hiking, going on long walks in the park, cooking and baking   PATIENT GOALS: Wants to get back to where she used to be, wants to feel confident that she isn't going to fall over   OBJECTIVE:  Note: Objective measures were completed at Evaluation unless otherwise noted.  DIAGNOSTIC FINDINGS: CT head 04/2022: IMPRESSION: Atrophy with small vessel chronic ischemic changes of deep cerebral white matter.   No acute intracranial abnormalities.  COGNITION: Overall cognitive status: Within functional limits for tasks assessed    POSTURE:  rounded shoulders   GAIT: Gait pattern: step through pattern and decreased stride length Distance walked: Clinic distances  Assistive device utilized: Single point cane Level of assistance: Modified independence    VESTIBULAR ASSESSMENT:  GENERAL OBSERVATION: Ambulates in with SPC    SYMPTOM BEHAVIOR:  Subjective history: See above, at end of session pt reported that she also gets lightheaded standing up   Non-Vestibular symptoms: sometimes will get nausea, but not everytime   Type of dizziness: Spinning/Vertigo  Frequency: you never know   Duration: quite a while   Aggravating factors: Induced by motion: turning body quickly and turning head quickly, reports getting up in the morning will make her feel out of sorts   Relieving factors: sitting and resting, closing eyes   Progression of symptoms: unchanged  OCULOMOTOR EXAM:  Ocular Alignment: normal  Ocular ROM: No Limitations  Spontaneous Nystagmus: absent  Gaze-Induced Nystagmus: absent  Smooth Pursuits: intact  Saccades: intact    VESTIBULAR - OCULAR  REFLEX:   Slow VOR: Normal  VOR Cancellation: Normal  Head-Impulse Test: HIT Right: negative HIT Left: positive Pt reporting dizziness afterwards     POSITIONAL TESTING: Right Dix-Hallpike: no nystagmus Left Dix-Hallpike: no nystagmus Right Roll Test: no nystagmus Left Roll Test: no nystagmus and pt reporting feeling a lightheaded feeling in her eye  Right Sidelying: no nystagmus and pt reporting feeling lightheaded in position and when sitting back up  Left Sidelying: no nystagmus and pt reporting feeling a little lightheaded coming up   MOTION SENSITIVITY:  Motion Sensitivity Quotient Intensity: 0 = none, 1 = Lightheaded, 2 = Mild, 3 = Moderate, 4 = Severe, 5 = Vomiting  Intensity  1. Sitting to supine 0  2. Supine to L side Reported a lightheaded feeling in her eyes  3. Supine to R side 0  4. Supine to sitting 0  5. L Hallpike-Dix 0  6. Up from L  0  7. R Hallpike-Dix 0  8. Up from R  0  9. Sitting, head tipped to L knee  10. Head up from L knee   11. Sitting, head tipped to R knee   12. Head up from R knee   13. Sitting head turns x5   14.Sitting head nods x5   15. In stance, 180 turn to L    16. In stance, 180 turn to R                                                                                                                               TREATMENT DATE: 04/11/24   Self-Care: Educated on clinical findings, pt negative today for BPPV, pt took Meclizine  this morning so educated pt not to take it before next PT session as it suppresses pt's vestibular system, role of vestibular PT in regards to dizziness   PATIENT EDUCATION: Education details: See above, POC  Person educated: Patient Education method: Explanation Education comprehension: verbalized understanding  HOME EXERCISE PROGRAM: Will provide at future session   GOALS: Goals reviewed with patient? Yes  SHORT TERM GOALS: ALL STGS = LTGS  LONG TERM GOALS: Target date: 05/09/2024  MSQ to be  assessed with LTG written.  Baseline:  Goal status: INITIAL  2.  mCTSIB to be assessed with LTG written.  Baseline:  Goal status: INITIAL  3.  DVA to be assessed with LTG written.  Baseline:  Goal status: INITIAL    ASSESSMENT:  CLINICAL IMPRESSION: Patient is a 73 year old female referred to Neuro OPPT for BPPV.   Pt's PMH is significant for: CAD, GAD, MI (1995), R knee replacement, Dysfunction of right eustachian tube, essential tremor (per pt). The following deficits were present during the exam: unsteadiness/imbalance, pt with positive HIT to the L and dizziness, indicating impaired VOR. Pt negative for positional testing for BPPV with no nystagmus noted and just lightheaded when returning to sitting. Pt did take a Meclizine  this morning, which can impact the findings of vestibular testing. Will re-assess in future session when pt has not taken Meclizine . Pt would benefit from skilled PT to address these impairments and functional limitations to maximize functional mobility independence and decr dizzness/improve balance.   OBJECTIVE IMPAIRMENTS: Abnormal gait, decreased activity tolerance, decreased balance, decreased mobility, difficulty walking, and dizziness.   ACTIVITY LIMITATIONS: bending, standing, transfers, bed mobility, and locomotion level  PARTICIPATION LIMITATIONS: community activity and yard work  PERSONAL FACTORS: Age, Past/current experiences, Time since onset of injury/illness/exacerbation, and 3+ comorbidities: CAD, GAD, MI (1995), R knee replacement, Dysfunction of right eustachian tube, essential tremor (per pt) are also affecting patient's functional outcome.   REHAB POTENTIAL: Good  CLINICAL DECISION MAKING: Evolving/moderate complexity  EVALUATION COMPLEXITY: Moderate   PLAN:  PT FREQUENCY: 1x/week  PT DURATION: 6 weeks - anticipate 4 weeks, due to delay in scheduling   PLANNED INTERVENTIONS: 97164- PT Re-evaluation, 97110-Therapeutic exercises,  97530- Therapeutic activity, 97112- Neuromuscular re-education, 97535- Self Care, [id number]- Manual therapy, [phone]- Gait training, [phone]- Canalith repositioning, Patient/Family education, Balance training, Stair training, Vestibular training,  Visual/preceptual remediation/compensation, and DME instructions  PLAN FOR NEXT SESSION: finish MSQ and re-check positional testing now that pt not on Meclizine , look at mCTSIB/DVA and write goals as needed    Check BP in standing too, at end of session pt reporting feeling lightheaded standing up as well    Sheffield LOISE Senate, PT, DPT 04/11/2024, 2:06 PM

## 2024-04-11 ENCOUNTER — Encounter: Payer: Self-pay | Admitting: Physical Therapy

## 2024-04-11 ENCOUNTER — Ambulatory Visit: Attending: Family Medicine | Admitting: Physical Therapy

## 2024-04-11 VITALS — BP 115/71 | HR 59

## 2024-04-11 DIAGNOSIS — R2681 Unsteadiness on feet: Secondary | ICD-10-CM | POA: Insufficient documentation

## 2024-04-11 DIAGNOSIS — R42 Dizziness and giddiness: Secondary | ICD-10-CM | POA: Insufficient documentation

## 2024-04-18 ENCOUNTER — Ambulatory Visit: Attending: Family Medicine | Admitting: Physical Therapy

## 2024-04-18 ENCOUNTER — Encounter: Payer: Self-pay | Admitting: Physical Therapy

## 2024-04-18 VITALS — BP 107/65 | HR 71

## 2024-04-18 DIAGNOSIS — R42 Dizziness and giddiness: Secondary | ICD-10-CM | POA: Insufficient documentation

## 2024-04-18 DIAGNOSIS — R2681 Unsteadiness on feet: Secondary | ICD-10-CM | POA: Diagnosis present

## 2024-04-18 NOTE — Therapy (Signed)
 OUTPATIENT PHYSICAL THERAPY VESTIBULAR TREATMENT     Patient Name: Jillian [name] MRN: [id number] DOB:Oct 27, 1950, 73 y.o., female Today's Date: 04/18/2024  END OF SESSION:  PT End of Session - 04/18/24 1448     Visit Number 2    Number of Visits 5    Date for Recertification  05/23/24   due to delay in scheduling   Authorization Type [employer]    PT Start Time 1446    PT Stop Time 1527    PT Time Calculation (min) 41 min    Activity Tolerance Patient tolerated treatment well    Behavior During Therapy WFL for tasks assessed/performed          Past Medical History:  Diagnosis Date   Adjustment disorder, unspecified    Arthritis    Coronary artery disease    Edema leg    GAD (generalized anxiety disorder)    GERD (gastroesophageal reflux disease)    Myocardial infarction [name]) 1995   saw dr burnard    PONV (postoperative nausea and vomiting)    Tremor, unspecified    Unspecified mood (affective) disorder    Vein disorder    lower legs   Past Surgical History:  Procedure Laterality Date   CARDIAC CATHETERIZATION     COLONOSCOPY W/ POLYPECTOMY  06/17/2011   KNEE ARTHROSCOPY Right 11/23/2012   Procedure: RIGHT KNEE ARTHROSCOPY PARTIAL MEDIAL MENISECTOMY CHRONDROPLASTY;  Surgeon: Maude KANDICE Herald, MD;  Location: [facility];  Service: Orthopedics;  Laterality: Right;   TOTAL KNEE ARTHROPLASTY Right 09/23/2021   Procedure: TOTAL KNEE ARTHROPLASTY;  Surgeon: Melodi Lerner, MD;  Location: [facility];  Service: Orthopedics;  Laterality: Right;   TUBAL LIGATION  06/17/1971   VEIN SURGERY     Patient Active Problem List   Diagnosis Date Noted   OA (osteoarthritis) of knee 09/23/2021   Osteoarthritis of right knee 09/23/2021   Allergic contact dermatitis 07/22/2021   Closed fracture of left patella 06/30/2017   Pre-diabetes 12/20/2015   Generalized anxiety disorder 09/20/2015   Major depressive disorder, recurrent episode, moderate (HCC)  09/20/2015   Venous insufficiency (chronic) (peripheral) 09/12/2015   Anxiety 09/04/2015   Insomnia 09/04/2015   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 08/28/2015   Gastroesophageal reflux disease without esophagitis 05/22/2015   Osteopenia 05/22/2015   Panic attacks 05/22/2015   Recurrent major depressive disorder 05/22/2015   Tremor 05/22/2015   History of DVT of lower extremity 07/23/2012   LABYRINTHITIS 11/17/2007   HEADACHE 11/17/2007    PCP: Burney Darice CROME, MD REFERRING PROVIDER: Burney Darice CROME, MD  REFERRING DIAG: BPPV   THERAPY DIAG:  Dizziness and giddiness  Unsteadiness on feet  ONSET DATE: 02/06/2024  Rationale for Evaluation and Treatment: Rehabilitation  SUBJECTIVE:   SUBJECTIVE STATEMENT: Hasn't had Meclizine  in the past 3 days. Will get dizzy when turning her head too quickly. No falls.   Pt accompanied by: self  PERTINENT HISTORY: PMH: CAD, GAD, MI (1995), R knee replacement, Dysfunction of right eustachian tube, essential tremor (per pt)  PAIN:  Are you having pain? No  Vitals:   04/18/24 1450 04/18/24 1452  BP: 102/70 107/65  Pulse: 67 71      PRECAUTIONS: Fall  FALLS: Has patient fallen in last 6 months? No  LIVING ENVIRONMENT: Lives with: lives alone and and dog  Lives in: House/apartment Stairs: Yes: External: 5 steps; can reach both Has following equipment at home: Single point cane, Walker - 4 wheeled, and thinking  about getting grab bars for her shower   PLOF: Independent, Independent with community mobility with device, and Leisure: hiking, going on long walks in the park, cooking and baking   PATIENT GOALS: Wants to get back to where she used to be, wants to feel confident that she isn't going to fall over   OBJECTIVE:  Note: Objective measures were completed at Evaluation unless otherwise noted.  DIAGNOSTIC FINDINGS: CT head 04/2022: IMPRESSION: Atrophy with small vessel chronic ischemic changes of  deep cerebral white matter.   No acute intracranial abnormalities.  COGNITION: Overall cognitive status: Within functional limits for tasks assessed    POSTURE:  rounded shoulders   GAIT: Gait pattern: step through pattern and decreased stride length Distance walked: Clinic distances  Assistive device utilized: Single point cane Level of assistance: Modified independence    VESTIBULAR ASSESSMENT:  GENERAL OBSERVATION: Ambulates in with SPC    SYMPTOM BEHAVIOR:  Subjective history: See above, at end of session pt reported that she also gets lightheaded standing up   Non-Vestibular symptoms: sometimes will get nausea, but not everytime   Type of dizziness: Spinning/Vertigo  Frequency: you never know   Duration: quite a while   Aggravating factors: Induced by motion: turning body quickly and turning head quickly, reports getting up in the morning will make her feel out of sorts   Relieving factors: sitting and resting, closing eyes   Progression of symptoms: unchanged  OCULOMOTOR EXAM:  Ocular Alignment: normal  Ocular ROM: No Limitations  Spontaneous Nystagmus: absent  Gaze-Induced Nystagmus: absent  Smooth Pursuits: intact  Saccades: intact    VESTIBULAR - OCULAR REFLEX:   Slow VOR: Normal  VOR Cancellation: Normal  Head-Impulse Test: HIT Right: negative HIT Left: positive Pt reporting dizziness afterwards     POSITIONAL TESTING: Right Dix-Hallpike: no nystagmus Left Dix-Hallpike: no nystagmus Right Roll Test: no nystagmus Left Roll Test: no nystagmus and pt reporting feeling a lightheaded feeling in her eye  Right Sidelying: no nystagmus and pt reporting feeling lightheaded in position and when sitting back up  Left Sidelying: no nystagmus and pt reporting feeling a little lightheaded coming up   MOTION SENSITIVITY:  Motion Sensitivity Quotient Intensity: 0 = none, 1 = Lightheaded, 2 = Mild, 3 = Moderate, 4 = Severe, 5 = Vomiting  Intensity  1.  Sitting to supine 0  2. Supine to L side Reported a lightheaded feeling in her eyes  3. Supine to R side 0  4. Supine to sitting 0  5. L Hallpike-Dix 0  6. Up from L  0  7. R Hallpike-Dix 0  8. Up from R  0  9. Sitting, head tipped to L knee   10. Head up from L knee   11. Sitting, head tipped to R knee   12. Head up from R knee   13. Sitting head turns x5   14.Sitting head nods x5   15. In stance, 180 turn to L    16. In stance, 180 turn to R  TREATMENT DATE: 04/18/24  Therapeutic Activity:  Vitals:   04/18/24 1450 04/18/24 1452  BP: 102/70 107/65  Pulse: 67 71  Seated, Standing No lightheadedness in standing   Re-assessed now pt not on Meclizine :  POSITIONAL TESTING: Right Roll Test: no nystagmus Left Roll Test: no nystagmus Right Sidelying: no nystagmus and feels unsteady when sitting up  Left Sidelying: no nystagmus and felt a little lightheaded in position and unsteady when sitting    MOTION SENSITIVITY:  Motion Sensitivity Quotient  Bolded performed on 04/18/24:  Intensity: 0 = none, 1 = Lightheaded, 2 = Mild, 3 = Moderate, 4 = Severe, 5 = Vomiting  Intensity  1. Sitting to supine 0  2. Supine to L side Reported a lightheaded feeling in her eyes  3. Supine to R side 0  4. Supine to sitting 0  5. L Hallpike-Dix 0  6. Up from L  0  7. R Hallpike-Dix 0  8. Up from R  0  9. Sitting, head tipped to L knee 0  10. Head up from L knee 0  11. Sitting, head tipped to R knee 0  12. Head up from R knee 0  13. Sitting head turns x5 3  14.Sitting head nods x5 0  15. In stance, 180 turn to L  2 (felt more unsteady like she might fall)  16. In stance, 180 turn to R 2 (felt more unsteady like she might fall)      M-CTSIB  Condition 1: Firm Surface, EO 30 Sec, Normal Sway  Condition 2: Firm Surface, EC 30 Sec, Mild Sway  Condition 3:  Foam Surface, EO 30 Sec, Mild/Mod Sway  Condition 4: Foam Surface, EC 11 Sec  Performed with feet apart, pt unable to stand with feet together on level ground   NMR:  Gaze Adaptation: x1 Viewing Horizontal: Position: Seated, Time: 30 seconds, Reps: 2, and Comment: no dizziness in seated. Attempted another 2 x 30 seconds in standing, pt initially performed first rep going too quick and reporting moderate dizziness, slowed down the 2nd rep with less sx  x1 Viewing Vertical:  Position: Standing, Time: 30 seconds, Reps: 2, and Comment: mild sx  Pt standing next to chair as needed for balance   Access Code: [email or web address] URL: [email or web address] Date: 04/18/2024 Prepared by: Sheffield Senate  Added balance to HEP:   Exercises - Wide Stance with Eyes Closed  - 1-2 x daily - 5 x weekly - 3 sets - 30 hold - Romberg Stance  - 1-2 x daily - 5 x weekly - 3 sets - 30 hold  PATIENT EDUCATION: Education details: Results of assessment now that pt not on Meclizine , pt negative for BPPV, results of mCTSIB and purpose of balance systems, initial HEP for standing VOR and corner balance  Person educated: Patient Education method: Explanation, Demonstration, Verbal cues, and Handouts Education comprehension: verbalized understanding, returned demonstration, verbal cues required, and needs further education  HOME EXERCISE PROGRAM: Standing VOR x1 in horizontal and vertical direction for 30 seconds with chair nearby   Access Code: [email or web address] URL: [email or web address] Date: 04/18/2024 Prepared by: Sheffield Senate  Exercises - Wide Stance with Eyes Closed  - 1-2 x daily - 5 x weekly - 3 sets - 30 hold - Romberg Stance  - 1-2 x daily - 5 x weekly - 3 sets - 30 hold  GOALS: Goals reviewed with patient? Yes  SHORT TERM GOALS: ALL STGS = LTGS  LONG TERM GOALS: Target date: 05/09/2024  Pt will  perform head motions and turning items on MSQ to a 1/5 or less in order to demo  improved motion sensitivity.  Baseline: 2-3/5 Goal status: INITIAL  2.  Pt will improve condition 4 on mCTSIB to at least 20 seconds to demo improved vestibular input for balance.  Baseline: 11 seconds (performed with feet apart) Goal status: INITIAL  3.  DVA to be assessed with LTG written.  Baseline:  Goal status: INITIAL    ASSESSMENT:  CLINICAL IMPRESSION: Pt returns for PT tx not on Meclizine . Assessed BP in sitting and standing as pt reports some lightheadedness in standing, pt's BP WFL in sitting and standing with no drop. Re-assessed positional testing and pt negative for BPPV. When looking at remainder of MSQ, pt only reporting mild/mod dizziness with seated head turns or with standing and turning with pt feeling off balance. Assessed mCTSIB (initially attempted to perform with feet together, but pt unable to hold this position, so performed with feet hip width distance) - pt with incr postural sway with conditions 2 and 3 and able to hold for 30 seconds. Pt only able to hold condition 4 for 11 seconds, indicating decr vestibular input for balance.  LTGs updated. Added corner balance exercises to HEP on level ground and standing VOR x1 tasks with pt having mild sx and needing to have a chair nearby for balance. Pt's dizziness sx appear to be a mix of a potential vestibular hypofunction and dysequilibrium. Will continue per POC.    OBJECTIVE IMPAIRMENTS: Abnormal gait, decreased activity tolerance, decreased balance, decreased mobility, difficulty walking, and dizziness.   ACTIVITY LIMITATIONS: bending, standing, transfers, bed mobility, and locomotion level  PARTICIPATION LIMITATIONS: community activity and yard work  PERSONAL FACTORS: Age, Past/current experiences, Time since onset of injury/illness/exacerbation, and 3+ comorbidities: CAD, GAD, MI (1995), R knee replacement, Dysfunction of right eustachian tube, essential tremor (per pt) are also affecting patient's functional  outcome.   REHAB POTENTIAL: Good  CLINICAL DECISION MAKING: Evolving/moderate complexity  EVALUATION COMPLEXITY: Moderate   PLAN:  PT FREQUENCY: 1x/week  PT DURATION: 6 weeks - anticipate 4 weeks, due to delay in scheduling   PLANNED INTERVENTIONS: 97164- PT Re-evaluation, 97110-Therapeutic exercises, 97530- Therapeutic activity, 97112- Neuromuscular re-education, 97535- Self Care, [id number]- Manual therapy, [phone]- Gait training, [phone]- Canalith repositioning, Patient/Family education, Balance training, Stair training, Vestibular training, Visual/preceptual remediation/compensation, and DME instructions  PLAN FOR NEXT SESSION: look at [facility] and write goal. Review standing VOR, work on balance!! With head motions, narrow BOS, EC, and unlevel surfaces.    Sheffield LOISE Senate, PT, DPT 04/18/2024, 3:28 PM

## 2024-04-18 NOTE — Patient Instructions (Signed)
 Gaze Stabilization: Standing Feet Apart    Feet shoulder width apart, keeping eyes on target on wall _a few___ feet away, tilt head down 15-30 and move head side to side for __30__ seconds. Repeat while moving head up and down for __30__ seconds.  Go slowly! Perform 2-3 reps of each.  Do __2__ sessions per day.   Gaze Stabilization: Tip Card  1.Target must remain in focus, not blurry, and appear stationary while head is in motion. 2.Perform exercises with small head movements (45 to either side of midline). 3.Increase speed of head motion so long as target is in focus. 4.If you wear eyeglasses, be sure you can see target through lens (therapist will give specific instructions for bifocal / progressive lenses). 5.These exercises may provoke dizziness or nausea. Work through these symptoms. If too dizzy, slow head movement slightly. Rest between each exercise. 6.Exercises demand concentration; avoid distractions. 7.For safety, perform standing exercises close to a counter, wall, corner, or next to someone.

## 2024-04-25 ENCOUNTER — Ambulatory Visit: Admitting: Physical Therapy

## 2024-04-25 ENCOUNTER — Encounter: Payer: Self-pay | Admitting: Physical Therapy

## 2024-04-25 DIAGNOSIS — R2681 Unsteadiness on feet: Secondary | ICD-10-CM

## 2024-04-25 DIAGNOSIS — R42 Dizziness and giddiness: Secondary | ICD-10-CM

## 2024-04-25 NOTE — Therapy (Signed)
 OUTPATIENT PHYSICAL THERAPY VESTIBULAR TREATMENT     Patient Name: Jillian [name] MRN: [id number] DOB:01-26-1951, 73 y.o., female Today's Date: 04/25/2024  END OF SESSION:  PT End of Session - 04/25/24 1533     Visit Number 3    Number of Visits 5    Date for Recertification  05/23/24   due to delay in scheduling   Authorization Type [employer]    PT Start Time 1532    PT Stop Time 1612    PT Time Calculation (min) 40 min    Equipment Utilized During Treatment Gait belt    Activity Tolerance Patient tolerated treatment well    Behavior During Therapy WFL for tasks assessed/performed          Past Medical History:  Diagnosis Date   Adjustment disorder, unspecified    Arthritis    Coronary artery disease    Edema leg    GAD (generalized anxiety disorder)    GERD (gastroesophageal reflux disease)    Myocardial infarction (HCC) 1995   saw dr burnard    PONV (postoperative nausea and vomiting)    Tremor, unspecified    Unspecified mood (affective) disorder    Vein disorder    lower legs   Past Surgical History:  Procedure Laterality Date   CARDIAC CATHETERIZATION     COLONOSCOPY W/ POLYPECTOMY  06/17/2011   KNEE ARTHROSCOPY Right 11/23/2012   Procedure: RIGHT KNEE ARTHROSCOPY PARTIAL MEDIAL MENISECTOMY CHRONDROPLASTY;  Surgeon: Maude KANDICE Herald, MD;  Location: [facility];  Service: Orthopedics;  Laterality: Right;   TOTAL KNEE ARTHROPLASTY Right 09/23/2021   Procedure: TOTAL KNEE ARTHROPLASTY;  Surgeon: Melodi Lerner, MD;  Location: [facility];  Service: Orthopedics;  Laterality: Right;   TUBAL LIGATION  06/17/1971   VEIN SURGERY     Patient Active Problem List   Diagnosis Date Noted   OA (osteoarthritis) of knee 09/23/2021   Osteoarthritis of right knee 09/23/2021   Allergic contact dermatitis 07/22/2021   Closed fracture of left patella 06/30/2017   Pre-diabetes 12/20/2015   Generalized anxiety disorder 09/20/2015   Major  depressive disorder, recurrent episode, moderate (HCC) 09/20/2015   Venous insufficiency (chronic) (peripheral) 09/12/2015   Anxiety 09/04/2015   Insomnia 09/04/2015   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 08/28/2015   Gastroesophageal reflux disease without esophagitis 05/22/2015   Osteopenia 05/22/2015   Panic attacks 05/22/2015   Recurrent major depressive disorder 05/22/2015   Tremor 05/22/2015   History of DVT of lower extremity 07/23/2012   LABYRINTHITIS 11/17/2007   HEADACHE 11/17/2007    PCP: Burney Darice CROME, MD REFERRING PROVIDER: Burney Darice CROME, MD  REFERRING DIAG: BPPV   THERAPY DIAG:  Dizziness and giddiness  Unsteadiness on feet  ONSET DATE: 02/06/2024  Rationale for Evaluation and Treatment: Rehabilitation  SUBJECTIVE:   SUBJECTIVE STATEMENT: Did not take Meclizine  and has not felt dizzy. Balance just feels off, but not dizzy. Last time she took Meclizine  was on Friday.   Pt accompanied by: self  PERTINENT HISTORY: PMH: CAD, GAD, MI (1995), R knee replacement, Dysfunction of right eustachian tube, essential tremor (per pt)  PAIN:  Are you having pain? No  There were no vitals filed for this visit.   PRECAUTIONS: Fall  FALLS: Has patient fallen in last 6 months? No  LIVING ENVIRONMENT: Lives with: lives alone and and dog  Lives in: House/apartment Stairs: Yes: External: 5 steps; can reach both Has following equipment at home: Single point cane, Walker -  4 wheeled, and thinking about getting grab bars for her shower   PLOF: Independent, Independent with community mobility with device, and Leisure: hiking, going on long walks in the park, cooking and baking   PATIENT GOALS: Wants to get back to where she used to be, wants to feel confident that she isn't going to fall over   OBJECTIVE:  Note: Objective measures were completed at Evaluation unless otherwise noted.  DIAGNOSTIC FINDINGS: CT head 04/2022: IMPRESSION: Atrophy  with small vessel chronic ischemic changes of deep cerebral white matter.   No acute intracranial abnormalities.  COGNITION: Overall cognitive status: Within functional limits for tasks assessed    POSTURE:  rounded shoulders   GAIT: Gait pattern: step through pattern and decreased stride length Distance walked: Clinic distances  Assistive device utilized: Single point cane Level of assistance: Modified independence    VESTIBULAR ASSESSMENT:  GENERAL OBSERVATION: Ambulates in with SPC    SYMPTOM BEHAVIOR:  Subjective history: See above, at end of session pt reported that she also gets lightheaded standing up   Non-Vestibular symptoms: sometimes will get nausea, but not everytime   Type of dizziness: Spinning/Vertigo  Frequency: you never know   Duration: quite a while   Aggravating factors: Induced by motion: turning body quickly and turning head quickly, reports getting up in the morning will make her feel out of sorts   Relieving factors: sitting and resting, closing eyes   Progression of symptoms: unchanged  OCULOMOTOR EXAM:  Ocular Alignment: normal  Ocular ROM: No Limitations  Spontaneous Nystagmus: absent  Gaze-Induced Nystagmus: absent  Smooth Pursuits: intact  Saccades: intact    VESTIBULAR - OCULAR REFLEX:   Slow VOR: Normal  VOR Cancellation: Normal  Head-Impulse Test: HIT Right: negative HIT Left: positive Pt reporting dizziness afterwards     POSITIONAL TESTING: Right Dix-Hallpike: no nystagmus Left Dix-Hallpike: no nystagmus Right Roll Test: no nystagmus Left Roll Test: no nystagmus and pt reporting feeling a lightheaded feeling in her eye  Right Sidelying: no nystagmus and pt reporting feeling lightheaded in position and when sitting back up  Left Sidelying: no nystagmus and pt reporting feeling a little lightheaded coming up                                                                                                                              TREATMENT DATE: 04/25/24  NMR:  Gaze Adaptation: x1 Viewing Horizontal: Position: Standing, Time: 30 seconds, Reps: 2, and Comment: cued to slightly incr speed the 2nd rep, no issues with dizziness or imbalance x1 Viewing Vertical:  Position: Standing, Time: 30 seconds, Reps: 1, and Comment: no dizziness  Pt did not have to hold onto a chair this time for balance   In corner:  On level ground: Narrow BOS (little bit of space between feet): Head turns 2 x 10 reps, Head nods 2 x 10  1/2 tandem stance, 2 x 30 seconds bilat, pt with mild sway, able  to use ankle strategy to maintain balance  On air ex: Wide BOS with EC: 4 x 20-30 seconds, frequent taps to walls/chair for balance  Feet hip width distance EO: 10 reps head turns, 10 reps head nods  At bottom of staircase: Alternating SLS taps to 6 step with no UE support (pt more fearful of this), performed 3 x 10 reps, initial CGA 10 reps sit <> stands with no UE support, 2 x 5 reps performing with EC, pt with tendency to brace BLE against mat table, cues for incr forward lean   PATIENT EDUCATION: Education details: additions to HEP for balance, discussed purpose of vestibular system for balance  Person educated: Patient Education method: Explanation, Demonstration, Verbal cues, and Handouts Education comprehension: verbalized understanding, returned demonstration, verbal cues required, and needs further education  HOME EXERCISE PROGRAM: Standing VOR x1 in horizontal and vertical direction for 30 seconds with chair nearby   Access Code: [email or web address] URL: [email or web address] Date: 04/25/2024 Prepared by: Sheffield Senate  Exercises - Wide Stance with Eyes Closed  - 1-2 x daily - 5 x weekly - 3 sets - 30 hold - Romberg Stance  - 1-2 x daily - 5 x weekly - 3 sets - 30 hold - Forward Step Touch  - 1 x daily - 5 x weekly - 2 sets - 10 reps - Standing with Head Rotation  - 1 x daily - 5 x weekly - 2 sets - 10  reps  GOALS: Goals reviewed with patient? Yes  SHORT TERM GOALS: ALL STGS = LTGS  LONG TERM GOALS: Target date: 05/09/2024  Pt will perform head motions and turning items on MSQ to a 1/5 or less in order to demo improved motion sensitivity.  Baseline: 2-3/5 Goal status: INITIAL  2.  Pt will improve condition 4 on mCTSIB to at least 20 seconds to demo improved vestibular input for balance.  Baseline: 11 seconds (performed with feet apart) Goal status: INITIAL  3.  DVA to be assessed with LTG written.  Baseline:  Goal status: INITIAL    ASSESSMENT:  CLINICAL IMPRESSION: Pt reports that she has not had any dizziness recently, just feeling more off balance. Pt challenged by narrow BOS for balance, even on level ground. With balance with EC, pt unable to hold with wide BOS on foam pad without using UE support, indicating impaired vestibular system for balance. Pt did better with standing VOR x1 today and did not have to hold on for balance compared to previous session where pt was more unsteady. Pt reports she reached out to her neurologist about getting tested for neuropathy, but does not want to get further tested as it involves needles. Will continue per POC.    OBJECTIVE IMPAIRMENTS: Abnormal gait, decreased activity tolerance, decreased balance, decreased mobility, difficulty walking, and dizziness.   ACTIVITY LIMITATIONS: bending, standing, transfers, bed mobility, and locomotion level  PARTICIPATION LIMITATIONS: community activity and yard work  PERSONAL FACTORS: Age, Past/current experiences, Time since onset of injury/illness/exacerbation, and 3+ comorbidities: CAD, GAD, MI (1995), R knee replacement, Dysfunction of right eustachian tube, essential tremor (per pt) are also affecting patient's functional outcome.   REHAB POTENTIAL: Good  CLINICAL DECISION MAKING: Evolving/moderate complexity  EVALUATION COMPLEXITY: Moderate   PLAN:  PT FREQUENCY: 1x/week  PT  DURATION: 6 weeks - anticipate 4 weeks, due to delay in scheduling   PLANNED INTERVENTIONS: 97164- PT Re-evaluation, 97110-Therapeutic exercises, 97530- Therapeutic activity, 97112- Neuromuscular re-education, 97535- Self Care, 02859- Manual therapy, [id number]- Gait training, [phone]- Canalith  repositioning, Patient/Family education, Balance training, Stair training, Vestibular training, Visual/preceptual remediation/compensation, and DME instructions  PLAN FOR NEXT SESSION: look at [facility] and write goal. progress standing VOR, work on balance!! With head motions, narrow BOS, EC, and unlevel surfaces.    Sheffield LOISE Senate, PT, DPT 04/25/2024, 4:12 PM

## 2024-05-02 ENCOUNTER — Encounter: Payer: Self-pay | Admitting: Physical Therapy

## 2024-05-02 ENCOUNTER — Ambulatory Visit: Admitting: Physical Therapy

## 2024-05-02 VITALS — BP 127/73 | HR 67

## 2024-05-02 DIAGNOSIS — R42 Dizziness and giddiness: Secondary | ICD-10-CM | POA: Diagnosis not present

## 2024-05-02 DIAGNOSIS — R2681 Unsteadiness on feet: Secondary | ICD-10-CM

## 2024-05-02 NOTE — Therapy (Signed)
 OUTPATIENT PHYSICAL THERAPY VESTIBULAR TREATMENT     Patient Name: Jillian [name] MRN: [id number] DOB:[birth year], 73 y.o., female Today's Date: 05/02/2024  END OF SESSION:  PT End of Session - 05/02/24 1448     Visit Number 4    Number of Visits 5    Date for Recertification  05/23/24   due to delay in scheduling   Authorization Type [employer]    PT Start Time 1447    PT Stop Time 1527    PT Time Calculation (min) 40 min    Equipment Utilized During Treatment Gait belt    Activity Tolerance Patient tolerated treatment well    Behavior During Therapy WFL for tasks assessed/performed          Past Medical History:  Diagnosis Date   Adjustment disorder, unspecified    Arthritis    Coronary artery disease    Edema leg    GAD (generalized anxiety disorder)    GERD (gastroesophageal reflux disease)    Myocardial infarction (HCC) 1995   saw dr burnard    PONV (postoperative nausea and vomiting)    Tremor, unspecified    Unspecified mood (affective) disorder    Vein disorder    lower legs   Past Surgical History:  Procedure Laterality Date   CARDIAC CATHETERIZATION     COLONOSCOPY W/ POLYPECTOMY  06/17/2011   KNEE ARTHROSCOPY Right 11/23/2012   Procedure: RIGHT KNEE ARTHROSCOPY PARTIAL MEDIAL MENISECTOMY CHRONDROPLASTY;  Surgeon: Maude KANDICE Herald, MD;  Location: [facility];  Service: Orthopedics;  Laterality: Right;   TOTAL KNEE ARTHROPLASTY Right 09/23/2021   Procedure: TOTAL KNEE ARTHROPLASTY;  Surgeon: Melodi Lerner, MD;  Location: [facility];  Service: Orthopedics;  Laterality: Right;   TUBAL LIGATION  06/17/1971   VEIN SURGERY     Patient Active Problem List   Diagnosis Date Noted   OA (osteoarthritis) of knee 09/23/2021   Osteoarthritis of right knee 09/23/2021   Allergic contact dermatitis 07/22/2021   Closed fracture of left patella 06/30/2017   Pre-diabetes 12/20/2015   Generalized anxiety disorder 09/20/2015   Major  depressive disorder, recurrent episode, moderate (HCC) 09/20/2015   Venous insufficiency (chronic) (peripheral) 09/12/2015   Anxiety 09/04/2015   Insomnia 09/04/2015   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 08/28/2015   Gastroesophageal reflux disease without esophagitis 05/22/2015   Osteopenia 05/22/2015   Panic attacks 05/22/2015   Recurrent major depressive disorder 05/22/2015   Tremor 05/22/2015   History of DVT of lower extremity 07/23/2012   LABYRINTHITIS 11/17/2007   HEADACHE 11/17/2007    PCP: Burney Darice CROME, MD REFERRING PROVIDER: Burney Darice CROME, MD  REFERRING DIAG: BPPV   THERAPY DIAG:  Dizziness and giddiness  Unsteadiness on feet  ONSET DATE: 02/06/2024  Rationale for Evaluation and Treatment: Rehabilitation  SUBJECTIVE:   SUBJECTIVE STATEMENT: Reports her R ear feels stuffed up. Feels like the R ear had some sticky drainage this morning. Has not felt right since she got up. Feels like she is going back and forth, not so much vertigo. Up until this morning was feeling fine. Had a great day yesterday. When she wakes up with this feeling, notes that it lasts all day. No falls. Exercises are going well. Took a Meclizine  this morning - does not feel like it helped.   Pt accompanied by: self  PERTINENT HISTORY: PMH: CAD, GAD, MI (1995), R knee replacement, Dysfunction of right eustachian tube, essential tremor (per pt)  PAIN:  Are you having  pain? No  Vitals:   05/02/24 1453  BP: 127/73  Pulse: 67     PRECAUTIONS: Fall  FALLS: Has patient fallen in last 6 months? No  LIVING ENVIRONMENT: Lives with: lives alone and and dog  Lives in: House/apartment Stairs: Yes: External: 5 steps; can reach both Has following equipment at home: Single point cane, Walker - 4 wheeled, and thinking about getting grab bars for her shower   PLOF: Independent, Independent with community mobility with device, and Leisure: hiking, going on long walks in  the park, cooking and baking   PATIENT GOALS: Wants to get back to where she used to be, wants to feel confident that she isn't going to fall over   OBJECTIVE:  Note: Objective measures were completed at Evaluation unless otherwise noted.  DIAGNOSTIC FINDINGS: CT head 04/2022: IMPRESSION: Atrophy with small vessel chronic ischemic changes of deep cerebral white matter.   No acute intracranial abnormalities.  COGNITION: Overall cognitive status: Within functional limits for tasks assessed    POSTURE:  rounded shoulders   GAIT: Gait pattern: step through pattern and decreased stride length Distance walked: Clinic distances  Assistive device utilized: Single point cane Level of assistance: Modified independence    VESTIBULAR ASSESSMENT:  GENERAL OBSERVATION: Ambulates in with SPC    SYMPTOM BEHAVIOR:  Subjective history: See above, at end of session pt reported that she also gets lightheaded standing up   Non-Vestibular symptoms: sometimes will get nausea, but not everytime   Type of dizziness: Spinning/Vertigo  Frequency: you never know   Duration: quite a while   Aggravating factors: Induced by motion: turning body quickly and turning head quickly, reports getting up in the morning will make her feel out of sorts   Relieving factors: sitting and resting, closing eyes   Progression of symptoms: unchanged  OCULOMOTOR EXAM:  Ocular Alignment: normal  Ocular ROM: No Limitations  Spontaneous Nystagmus: absent  Gaze-Induced Nystagmus: absent  Smooth Pursuits: intact  Saccades: intact    VESTIBULAR - OCULAR REFLEX:   Slow VOR: Normal  VOR Cancellation: Normal  Head-Impulse Test: HIT Right: negative HIT Left: positive Pt reporting dizziness afterwards                                                                                                                               TREATMENT DATE: 05/02/24  Therapeutic Activity: Vitals:   05/02/24 1453  BP:  127/73  Pulse: 67   POSITIONAL TESTING: Right Sidelying: no nystagmus and felt a little off when coming up, pt reports that these are the same sx she felt all day  Left Sidelying: no nystagmus and no sx coming up  Wilhelmena Carrel for habituation:  3 reps each side, pt only with sx when coming up from the R side    On air ex: 10 reps sit <> stands with no UE support  Alternating forward stepping off air ex and back 2 x 10 reps each  side, pt needing fingertip support  Feet hip width EC 2 x 20-30 seconds, intermittent taps for balance   In // bars: Gait with head turns 2 reps, and head nods 2 reps, pt more unsteady with head turns Gait with EC down and back x3 reps, pt with wider BOS  On rockerboard in A/P direction: Rocking board 20 reps for hip/ankle strategy  EO 2 x 10 reps head turns, 2 x 10 reps head nods Pt needing frequent taps to bars for balance    PATIENT EDUCATION: Education details: discussed purpose of vestibular system for balance and purpose of exercises, Wilhelmena Carrel addition to HEP for habituation  Person educated: Patient Education method: Programmer, Multimedia, Demonstration, Verbal cues, and Handouts Education comprehension: verbalized understanding, returned demonstration, verbal cues required, and needs further education  HOME EXERCISE PROGRAM: Standing VOR x1 in horizontal and vertical direction for 30 seconds with chair nearby   Access Code: [email or web address] URL: [email or web address] Date: 05/02/2024 Prepared by: Sheffield Senate  Exercises - Wide Stance with Eyes Closed  - 1-2 x daily - 5 x weekly - 3 sets - 30 hold - Romberg Stance  - 1-2 x daily - 5 x weekly - 3 sets - 30 hold - Forward Step Touch  - 1 x daily - 5 x weekly - 2 sets - 10 reps - Standing with Head Rotation  - 1 x daily - 5 x weekly - 2 sets - 10 reps - Brandt-Daroff Vestibular Exercise  - 1 x daily - 5 x weekly - 4-5 sets  GOALS: Goals reviewed with patient? Yes  SHORT TERM GOALS: ALL  STGS = LTGS  LONG TERM GOALS: Target date: 05/09/2024  Pt will perform head motions and turning items on MSQ to a 1/5 or less in order to demo improved motion sensitivity.  Baseline: 2-3/5 Goal status: INITIAL  2.  Pt will improve condition 4 on mCTSIB to at least 20 seconds to demo improved vestibular input for balance.  Baseline: 11 seconds (performed with feet apart) Goal status: INITIAL  3.  DVA to be assessed with LTG written.  Baseline:  Goal status: INITIAL    ASSESSMENT:  CLINICAL IMPRESSION: Pt reporting feeling more dizzy and off balance today. Vitals WNL. Pt took a Meclizine  this morning. Assessed positional testing in sidelying with pt negative for any nystagmus or BPPV, but pt reporting feeling off balance when coming up from R sidelying. Added Wilhelmena Carrel exercises to HEP for habituation. Remainder of session focused on standing balance tasks with vestibular input and on unlevel surfaces. Pt needing frequent UE support for balance. Pt reporting feeling better at end of session. Will continue per POC.    OBJECTIVE IMPAIRMENTS: Abnormal gait, decreased activity tolerance, decreased balance, decreased mobility, difficulty walking, and dizziness.   ACTIVITY LIMITATIONS: bending, standing, transfers, bed mobility, and locomotion level  PARTICIPATION LIMITATIONS: community activity and yard work  PERSONAL FACTORS: Age, Past/current experiences, Time since onset of injury/illness/exacerbation, and 3+ comorbidities: CAD, GAD, MI (1995), R knee replacement, Dysfunction of right eustachian tube, essential tremor (per pt) are also affecting patient's functional outcome.   REHAB POTENTIAL: Good  CLINICAL DECISION MAKING: Evolving/moderate complexity  EVALUATION COMPLEXITY: Moderate   PLAN:  PT FREQUENCY: 1x/week  PT DURATION: 6 weeks - anticipate 4 weeks, due to delay in scheduling   PLANNED INTERVENTIONS: 97164- PT Re-evaluation, 97110-Therapeutic exercises, 97530-  Therapeutic activity, 97112- Neuromuscular re-education, 97535- Self Care, [id number]- Manual therapy, [phone]- Gait training, [phone]- Canalith repositioning, Patient/Family education, Balance training, Stair training,  Vestibular training, Visual/preceptual remediation/compensation, and DME instructions  PLAN FOR NEXT SESSION: CHECK GOALS, RE-CERT OR D/C progress standing VOR, work on balance!! With head motions, narrow BOS, EC, and unlevel surfaces.    Sheffield LOISE Senate, PT, DPT 05/02/2024, 3:28 PM

## 2024-05-17 ENCOUNTER — Ambulatory Visit: Admitting: Physical Therapy

## 2024-05-18 ENCOUNTER — Encounter: Payer: Self-pay | Admitting: Physical Therapy

## 2024-05-18 ENCOUNTER — Ambulatory Visit: Attending: Family Medicine | Admitting: Physical Therapy

## 2024-05-18 DIAGNOSIS — R42 Dizziness and giddiness: Secondary | ICD-10-CM | POA: Diagnosis present

## 2024-05-18 DIAGNOSIS — R2681 Unsteadiness on feet: Secondary | ICD-10-CM | POA: Diagnosis present

## 2024-05-18 NOTE — Therapy (Signed)
 OUTPATIENT PHYSICAL THERAPY VESTIBULAR TREATMENT/DISCHARGE SUMMARY     Patient Name: Jillian [name] MRN: [id number] DOB:1951-04-07, 73 y.o., female Today's Date: 05/18/2024  PHYSICAL THERAPY DISCHARGE SUMMARY  Visits from Start of Care: 5  Current functional level related to goals / functional outcomes: See LTGs/Clinical Assessment Statement   Remaining deficits: Impaired balance (esp vestibular input), decr functional strength   Education / Equipment: HEP    Patient agrees to discharge. Patient goals were partially met. Patient is being discharged due to being pleased with the current functional level.   END OF SESSION:  PT End of Session - 05/18/24 1452     Visit Number 5    Number of Visits 5    Date for Recertification  05/23/24   due to delay in scheduling   Authorization Type [employer]    PT Start Time 1451   pt in restroom   PT Stop Time 1525   full time not used due to D/C visit   PT Time Calculation (min) 34 min    Equipment Utilized During Treatment Gait belt    Activity Tolerance Patient tolerated treatment well    Behavior During Therapy WFL for tasks assessed/performed          Past Medical History:  Diagnosis Date   Adjustment disorder, unspecified    Arthritis    Coronary artery disease    Edema leg    GAD (generalized anxiety disorder)    GERD (gastroesophageal reflux disease)    Myocardial infarction (HCC) 1995   saw dr burnard    PONV (postoperative nausea and vomiting)    Tremor, unspecified    Unspecified mood (affective) disorder    Vein disorder    lower legs   Past Surgical History:  Procedure Laterality Date   CARDIAC CATHETERIZATION     COLONOSCOPY W/ POLYPECTOMY  06/17/2011   KNEE ARTHROSCOPY Right 11/23/2012   Procedure: RIGHT KNEE ARTHROSCOPY PARTIAL MEDIAL MENISECTOMY CHRONDROPLASTY;  Surgeon: Maude KANDICE Herald, MD;  Location: [facility];  Service: Orthopedics;  Laterality: Right;   TOTAL KNEE  ARTHROPLASTY Right 09/23/2021   Procedure: TOTAL KNEE ARTHROPLASTY;  Surgeon: Melodi Lerner, MD;  Location: [facility];  Service: Orthopedics;  Laterality: Right;   TUBAL LIGATION  06/17/1971   VEIN SURGERY     Patient Active Problem List   Diagnosis Date Noted   OA (osteoarthritis) of knee 09/23/2021   Osteoarthritis of right knee 09/23/2021   Allergic contact dermatitis 07/22/2021   Closed fracture of left patella 06/30/2017   Pre-diabetes 12/20/2015   Generalized anxiety disorder 09/20/2015   Major depressive disorder, recurrent episode, moderate (HCC) 09/20/2015   Venous insufficiency (chronic) (peripheral) 09/12/2015   Anxiety 09/04/2015   Insomnia 09/04/2015   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 08/28/2015   Gastroesophageal reflux disease without esophagitis 05/22/2015   Osteopenia 05/22/2015   Panic attacks 05/22/2015   Recurrent major depressive disorder 05/22/2015   Tremor 05/22/2015   History of DVT of lower extremity 07/23/2012   LABYRINTHITIS 11/17/2007   HEADACHE 11/17/2007    PCP: Burney Darice CROME, MD REFERRING PROVIDER: Burney Darice CROME, MD  REFERRING DIAG: BPPV   THERAPY DIAG:  Dizziness and giddiness  Unsteadiness on feet  ONSET DATE: 02/06/2024  Rationale for Evaluation and Treatment: Rehabilitation  SUBJECTIVE:   SUBJECTIVE STATEMENT: Has been working on her exercises and feels more confident. Feels like they are really helping. No falls. Haven't had her Meclizine  in 6 or 7 days and have  not had any symptoms. Reports her mother needs her a lot at home and is really busy around the house with the holidays.   Pt accompanied by: self  PERTINENT HISTORY: PMH: CAD, GAD, MI (1995), R knee replacement, Dysfunction of right eustachian tube, essential tremor (per pt)  PAIN:  Are you having pain? No  There were no vitals filed for this visit.   PRECAUTIONS: Fall  FALLS: Has patient fallen in last 6 months? No  LIVING  ENVIRONMENT: Lives with: lives alone and and dog  Lives in: House/apartment Stairs: Yes: External: 5 steps; can reach both Has following equipment at home: Single point cane, Walker - 4 wheeled, and thinking about getting grab bars for her shower   PLOF: Independent, Independent with community mobility with device, and Leisure: hiking, going on long walks in the park, cooking and baking   PATIENT GOALS: Wants to get back to where she used to be, wants to feel confident that she isn't going to fall over   OBJECTIVE:  Note: Objective measures were completed at Evaluation unless otherwise noted.  DIAGNOSTIC FINDINGS: CT head 04/2022: IMPRESSION: Atrophy with small vessel chronic ischemic changes of deep cerebral white matter.   No acute intracranial abnormalities.  COGNITION: Overall cognitive status: Within functional limits for tasks assessed    POSTURE:  rounded shoulders   GAIT: Gait pattern: step through pattern and decreased stride length Distance walked: Clinic distances  Assistive device utilized: Single point cane Level of assistance: Modified independence    VESTIBULAR ASSESSMENT:  GENERAL OBSERVATION: Ambulates in with SPC    SYMPTOM BEHAVIOR:  Subjective history: See above, at end of session pt reported that she also gets lightheaded standing up   Non-Vestibular symptoms: sometimes will get nausea, but not everytime   Type of dizziness: Spinning/Vertigo  Frequency: you never know   Duration: quite a while   Aggravating factors: Induced by motion: turning body quickly and turning head quickly, reports getting up in the morning will make her feel out of sorts   Relieving factors: sitting and resting, closing eyes   Progression of symptoms: unchanged  OCULOMOTOR EXAM:  Ocular Alignment: normal  Ocular ROM: No Limitations  Spontaneous Nystagmus: absent  Gaze-Induced Nystagmus: absent  Smooth Pursuits: intact  Saccades: intact    VESTIBULAR - OCULAR  REFLEX:   Slow VOR: Normal  VOR Cancellation: Normal  Head-Impulse Test: HIT Right: negative HIT Left: positive Pt reporting dizziness afterwards                                                                                                                               TREATMENT DATE: 05/18/24  Therapeutic Activity: Discussed POC going forwards - pt feels pleased and a lot better with her progress in therapy so far and has been consistent with HEP. Discussed things are busy with the holidays coming up and pt taking care of her mom, would like to be discharged at this time  and continue with HEP and will get a new referral in a couple months to return to therapy when life is less busy.   Condition 4 of mCTSIB: 5 seconds Performed with feet apart, unable to do with feet together   MOTION SENSITIVITY:            Motion Sensitivity Quotient   Intensity: 0 = none, 1 = Lightheaded, 2 = Mild, 3 = Moderate, 4 = Severe, 5 = Vomiting   Intensity Performed on 04/18/24 05/18/24  1. Sitting to supine 0   2. Supine to L side Reported a lightheaded feeling in her eyes   3. Supine to R side 0   4. Supine to sitting 0   5. L Hallpike-Dix 0   6. Up from L  0   7. R Hallpike-Dix 0   8. Up from R  0   9. Sitting, head tipped to L knee 0   10. Head up from L knee 0   11. Sitting, head tipped to R knee 0   12. Head up from R knee 0   13. Sitting head turns x5 3 0  14.Sitting head nods x5 0   15. In stance, 180 turn to L  2 (felt more unsteady like she might fall) 0  16. In stance, 180 turn to R 2 (felt more unsteady like she might fall) 0   Reviewed HEP below and provided new handout:   Gaze Adaptation: x1 Viewing Horizontal: Position: Standing, Time: 30 seconds, Reps: 2, and Comment: cued to slightly incr speed the 2nd rep, no issues with dizziness or imbalance x1 Viewing Vertical:  Position: Standing, Time: 30 seconds, Reps: 1, and Comment: no dizziness  Began with feet more apart and then  closer together (progressed as HEP)   Access Code: [email or web address] URL: [email or web address] Date: 05/18/2024 Prepared by: Sheffield Senate  Exercises - Standing Hip Abduction with Counter Support  - 1-2 x daily - 5 x weekly - 2 sets - 10 reps - Standing Marching  - 1-2 x daily - 5 x weekly - 2 sets - 10 reps - Wide Stance with Eyes Closed on Foam Pad  - 1-2 x daily - 5 x weekly - 3 sets - 30 hold - standing on single pillow - Romberg Stance on Foam Pad with Head Rotation  - 1-2 x daily - 5 x weekly - 2 sets - 10 reps - standing on single pillow, 10 reps head turns, 10 reps head nods  - Brandt-Daroff Vestibular Exercise  - 1-2 x daily - 5 x weekly - 4-5 sets   PATIENT EDUCATION: Education details: final HEP, D/C from PT at this time with pt can return in a few months with a new referral  Person educated: Patient Education method: Explanation, Demonstration, Verbal cues, and Handouts Education comprehension: verbalized understanding, returned demonstration, verbal cues required, and needs further education  HOME EXERCISE PROGRAM: Standing VOR x1 in horizontal and vertical direction for 30 seconds with chair nearby   Access Code: [email or web address] URL: [email or web address] Date: 05/18/2024 Prepared by: Sheffield Senate  Exercises - Standing Hip Abduction with Counter Support  - 1-2 x daily - 5 x weekly - 2 sets - 10 reps - Standing Marching  - 1-2 x daily - 5 x weekly - 2 sets - 10 reps - Wide Stance with Eyes Closed on Foam Pad  - 1-2 x daily - 5 x weekly - 3 sets - 30 hold - Romberg Stance on Foam Pad with  Head Rotation  - 1-2 x daily - 5 x weekly - 2 sets - 10 reps - Brandt-Daroff Vestibular Exercise  - 1-2 x daily - 5 x weekly - 4-5 sets  GOALS: Goals reviewed with patient? Yes  SHORT TERM GOALS: ALL STGS = LTGS  LONG TERM GOALS: Target date: 05/09/2024  Pt will perform head motions and turning items on MSQ to a 1/5 or less in order to demo improved motion  sensitivity.  Baseline: 2-3/5  0/5 on those tasks on 05/18/24 Goal status: MET  2.  Pt will improve condition 4 on mCTSIB to at least 20 seconds to demo improved vestibular input for balance.  Baseline: 11 seconds (performed with feet apart)  5 seconds (performed with feet apart) on 12/3 Goal status: NOT MET  3.  DVA to be assessed with LTG written.  Baseline: N/A was not assessed  Goal status: INITIAL    ASSESSMENT:  CLINICAL IMPRESSION: Today's skilled session focused on assessing LTGs. Pt met LTG #1 in regards to MSQ, pt demonstrating improvements in motion sensitivities with head turns and turning, with pt reporting no dizziness. Pt did not meet LTG #2, pt still with impaired balance with vision removed on foam, indicating decr vestibular input for balance. Did not write goal for DVA as not assessed, but pt with improvements with standing VOR exercises. Pt subjectively reports that she feels more confident with her balance since starting PT and has been consistently performing her HEP. Pt would like to be discharged at this time and to continue to work on her HEP (progressed during session today), and pt plans on getting a new referral to PT to continue to work on her balance in the new year.    OBJECTIVE IMPAIRMENTS: Abnormal gait, decreased activity tolerance, decreased balance, decreased mobility, difficulty walking, and dizziness.   ACTIVITY LIMITATIONS: bending, standing, transfers, bed mobility, and locomotion level  PARTICIPATION LIMITATIONS: community activity and yard work  PERSONAL FACTORS: Age, Past/current experiences, Time since onset of injury/illness/exacerbation, and 3+ comorbidities: CAD, GAD, MI (1995), R knee replacement, Dysfunction of right eustachian tube, essential tremor (per pt) are also affecting patient's functional outcome.   REHAB POTENTIAL: Good  CLINICAL DECISION MAKING: Evolving/moderate complexity  EVALUATION COMPLEXITY:  Moderate   PLAN:  PT FREQUENCY: 1x/week  PT DURATION: 6 weeks - anticipate 4 weeks, due to delay in scheduling   PLANNED INTERVENTIONS: 97164- PT Re-evaluation, 97110-Therapeutic exercises, 97530- Therapeutic activity, 97112- Neuromuscular re-education, 97535- Self Care, [id number]- Manual therapy, [phone]- Gait training, [phone]- Canalith repositioning, Patient/Family education, Balance training, Stair training, Vestibular training, Visual/preceptual remediation/compensation, and DME instructions  PLAN FOR NEXT SESSION: D/C   Sheffield LOISE Senate, PT, DPT 05/18/2024, 3:40 PM
